# Patient Record
Sex: Male | Born: 1935 | Race: White | Hispanic: No | Smoking: Former smoker
Health system: Southern US, Community
[De-identification: ages and names within clinical notes are randomized; demographics above are authoritative.]

## PROBLEM LIST (undated history)

## (undated) DIAGNOSIS — K219 Gastro-esophageal reflux disease without esophagitis: Secondary | ICD-10-CM

## (undated) DIAGNOSIS — E785 Hyperlipidemia, unspecified: Secondary | ICD-10-CM

## (undated) DIAGNOSIS — I10 Essential (primary) hypertension: Secondary | ICD-10-CM

## (undated) DIAGNOSIS — Z87442 Personal history of urinary calculi: Secondary | ICD-10-CM

## (undated) DIAGNOSIS — I2510 Atherosclerotic heart disease of native coronary artery without angina pectoris: Secondary | ICD-10-CM

## (undated) DIAGNOSIS — I252 Old myocardial infarction: Secondary | ICD-10-CM

## (undated) DIAGNOSIS — E875 Hyperkalemia: Secondary | ICD-10-CM

## (undated) DIAGNOSIS — R931 Abnormal findings on diagnostic imaging of heart and coronary circulation: Secondary | ICD-10-CM

## (undated) DIAGNOSIS — I219 Acute myocardial infarction, unspecified: Secondary | ICD-10-CM

## (undated) DIAGNOSIS — C61 Malignant neoplasm of prostate: Secondary | ICD-10-CM

## (undated) DIAGNOSIS — Z8551 Personal history of malignant neoplasm of bladder: Secondary | ICD-10-CM

## (undated) HISTORY — DX: Acute myocardial infarction, unspecified: I21.9

## (undated) HISTORY — DX: Hyperkalemia: E87.5

## (undated) HISTORY — PX: APPENDECTOMY: SHX54

## (undated) HISTORY — PX: CORONARY ARTERY BYPASS GRAFT: SHX141

## (undated) HISTORY — DX: Essential (primary) hypertension: I10

## (undated) HISTORY — DX: Hyperlipidemia, unspecified: E78.5

## (undated) HISTORY — DX: Personal history of malignant neoplasm of bladder: Z85.51

## (undated) HISTORY — DX: Malignant neoplasm of prostate: C61

## (undated) HISTORY — DX: Personal history of urinary calculi: Z87.442

## (undated) HISTORY — PX: LITHOTRIPSY: SUR834

## (undated) HISTORY — DX: Atherosclerotic heart disease of native coronary artery without angina pectoris: I25.10

## (undated) HISTORY — DX: Abnormal findings on diagnostic imaging of heart and coronary circulation: R93.1

## (undated) HISTORY — DX: Old myocardial infarction: I25.2

---

## 1997-08-20 ENCOUNTER — Ambulatory Visit (HOSPITAL_COMMUNITY): Admission: RE | Admit: 1997-08-20 | Discharge: 1997-08-20 | Payer: Self-pay | Admitting: *Deleted

## 1997-09-11 ENCOUNTER — Ambulatory Visit (HOSPITAL_COMMUNITY): Admission: RE | Admit: 1997-09-11 | Discharge: 1997-09-11 | Payer: Self-pay | Admitting: *Deleted

## 1998-01-08 ENCOUNTER — Observation Stay (HOSPITAL_COMMUNITY): Admission: RE | Admit: 1998-01-08 | Discharge: 1998-01-09 | Payer: Self-pay | Admitting: *Deleted

## 1998-02-05 ENCOUNTER — Encounter: Payer: Self-pay | Admitting: *Deleted

## 1998-02-05 ENCOUNTER — Ambulatory Visit (HOSPITAL_COMMUNITY): Admission: RE | Admit: 1998-02-05 | Discharge: 1998-02-05 | Payer: Self-pay | Admitting: *Deleted

## 1998-09-20 ENCOUNTER — Other Ambulatory Visit: Admission: RE | Admit: 1998-09-20 | Discharge: 1998-09-20 | Payer: Self-pay | Admitting: *Deleted

## 1998-09-30 ENCOUNTER — Encounter: Admission: RE | Admit: 1998-09-30 | Discharge: 1998-12-13 | Payer: Self-pay | Admitting: Radiation Oncology

## 1998-10-12 ENCOUNTER — Encounter: Payer: Self-pay | Admitting: Radiation Oncology

## 1998-10-12 ENCOUNTER — Ambulatory Visit (HOSPITAL_COMMUNITY): Admission: RE | Admit: 1998-10-12 | Discharge: 1998-10-12 | Payer: Self-pay | Admitting: Radiation Oncology

## 1998-12-14 ENCOUNTER — Encounter: Admission: RE | Admit: 1998-12-14 | Discharge: 1999-03-14 | Payer: Self-pay | Admitting: Radiation Oncology

## 1999-06-24 ENCOUNTER — Encounter: Admission: RE | Admit: 1999-06-24 | Discharge: 1999-09-22 | Payer: Self-pay | Admitting: Radiation Oncology

## 1999-10-13 ENCOUNTER — Emergency Department (HOSPITAL_COMMUNITY): Admission: EM | Admit: 1999-10-13 | Discharge: 1999-10-13 | Payer: Self-pay | Admitting: Emergency Medicine

## 1999-10-13 ENCOUNTER — Encounter: Payer: Self-pay | Admitting: Emergency Medicine

## 2000-06-18 ENCOUNTER — Encounter: Admission: RE | Admit: 2000-06-18 | Discharge: 2000-06-18 | Payer: Self-pay | Admitting: *Deleted

## 2000-06-18 ENCOUNTER — Encounter: Payer: Self-pay | Admitting: *Deleted

## 2003-01-20 ENCOUNTER — Ambulatory Visit (HOSPITAL_COMMUNITY): Admission: RE | Admit: 2003-01-20 | Discharge: 2003-01-21 | Payer: Self-pay | Admitting: Cardiology

## 2003-01-20 HISTORY — PX: CORONARY ANGIOPLASTY: SHX604

## 2005-03-06 ENCOUNTER — Inpatient Hospital Stay (HOSPITAL_COMMUNITY): Admission: EM | Admit: 2005-03-06 | Discharge: 2005-03-08 | Payer: Self-pay | Admitting: Emergency Medicine

## 2005-03-07 HISTORY — PX: CARDIAC CATHETERIZATION: SHX172

## 2005-09-01 ENCOUNTER — Encounter: Admission: RE | Admit: 2005-09-01 | Discharge: 2005-09-01 | Payer: Self-pay | Admitting: Cardiology

## 2008-03-10 ENCOUNTER — Inpatient Hospital Stay (HOSPITAL_COMMUNITY): Admission: EM | Admit: 2008-03-10 | Discharge: 2008-03-13 | Payer: Self-pay | Admitting: Emergency Medicine

## 2008-03-11 HISTORY — PX: CYSTOSCOPY W/ URETERAL STENT PLACEMENT: SHX1429

## 2008-04-01 ENCOUNTER — Ambulatory Visit (HOSPITAL_BASED_OUTPATIENT_CLINIC_OR_DEPARTMENT_OTHER): Admission: RE | Admit: 2008-04-01 | Discharge: 2008-04-01 | Payer: Self-pay | Admitting: Urology

## 2008-04-15 ENCOUNTER — Ambulatory Visit (HOSPITAL_COMMUNITY): Admission: RE | Admit: 2008-04-15 | Discharge: 2008-04-16 | Payer: Self-pay | Admitting: Urology

## 2008-05-15 DIAGNOSIS — R931 Abnormal findings on diagnostic imaging of heart and coronary circulation: Secondary | ICD-10-CM

## 2008-05-15 HISTORY — DX: Abnormal findings on diagnostic imaging of heart and coronary circulation: R93.1

## 2009-02-12 IMAGING — CR DG ABDOMEN 1V
1 series · 1 of 1 positions shown · non-contrast
Comparison: 04/01/2008

CLINICAL DATA: Left renal calculus, preop

ABDOMEN - 1 VIEW

[t abdomen supine]
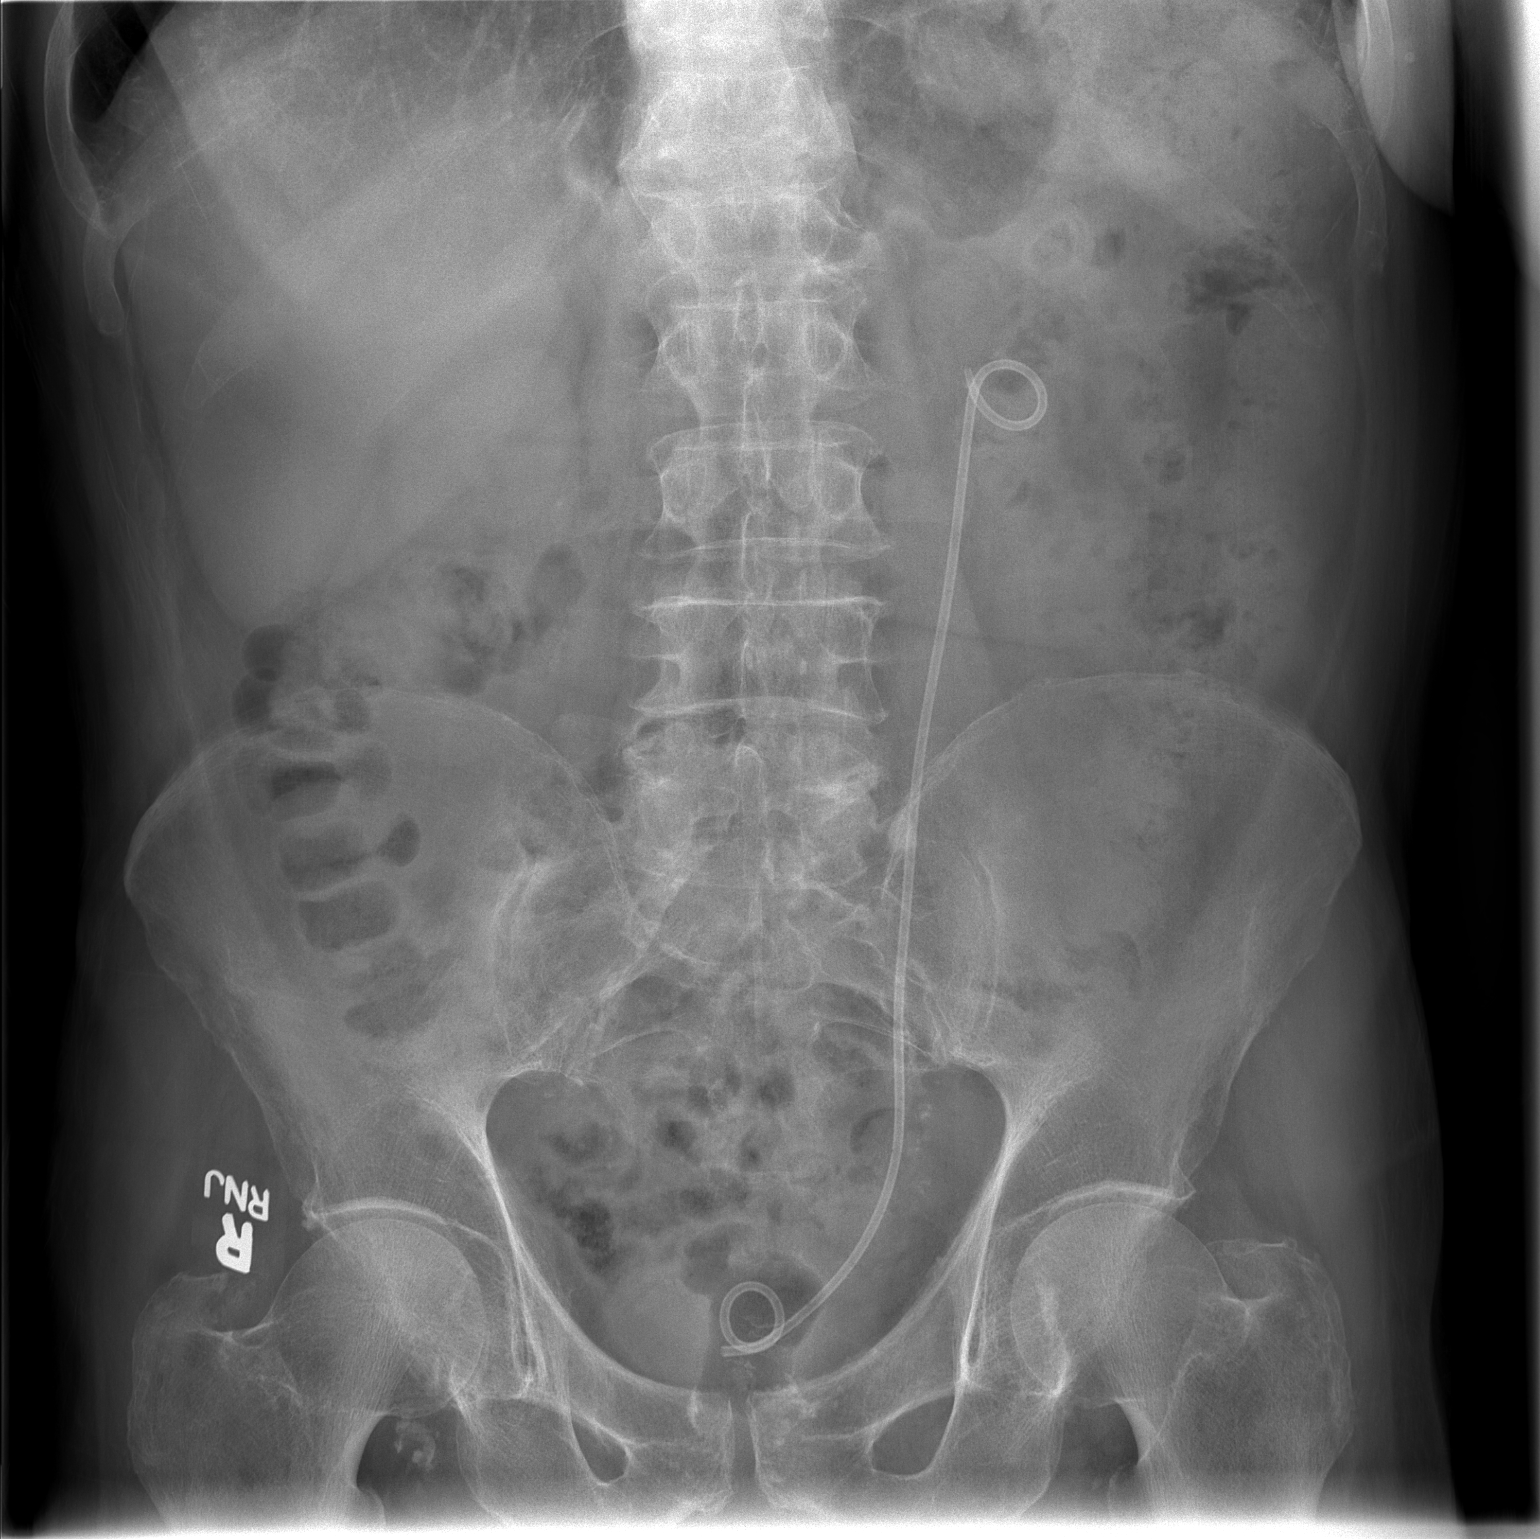

[1 of 1 positions shown; findings below may reference images not displayed]

FINDINGS: Left double-J ureteral stent projects in expected
location.  12 mm calculus projects in the mid left ureter at the
level of the upper margin of the sacroiliac joint, without
significant progression since the previous film.  Normal bowel gas
pattern.  Pelvic vascular calcifications.
IMPRESSION: 1.  Stable position of 12 mm mid-left ureteral calculus with double-
J stent in place.
2.  Normal bowel gas pattern

## 2010-02-01 ENCOUNTER — Ambulatory Visit: Payer: Self-pay | Admitting: Cardiology

## 2010-06-08 ENCOUNTER — Encounter: Payer: Self-pay | Admitting: Cardiology

## 2010-08-02 ENCOUNTER — Other Ambulatory Visit: Payer: Self-pay | Admitting: Cardiology

## 2010-08-02 ENCOUNTER — Ambulatory Visit (INDEPENDENT_AMBULATORY_CARE_PROVIDER_SITE_OTHER): Payer: Medicare Other | Admitting: Cardiology

## 2010-08-02 ENCOUNTER — Encounter: Payer: Self-pay | Admitting: Cardiology

## 2010-08-02 DIAGNOSIS — Z79899 Other long term (current) drug therapy: Secondary | ICD-10-CM

## 2010-08-02 DIAGNOSIS — E7800 Pure hypercholesterolemia, unspecified: Secondary | ICD-10-CM

## 2010-08-02 DIAGNOSIS — E785 Hyperlipidemia, unspecified: Secondary | ICD-10-CM | POA: Insufficient documentation

## 2010-08-02 DIAGNOSIS — I10 Essential (primary) hypertension: Secondary | ICD-10-CM

## 2010-08-02 DIAGNOSIS — I2510 Atherosclerotic heart disease of native coronary artery without angina pectoris: Secondary | ICD-10-CM | POA: Insufficient documentation

## 2010-08-02 LAB — COMPREHENSIVE METABOLIC PANEL
ALT: <8 U/L (ref 0–53)
AST: 17 U/L (ref 0–37)
Albumin: 4.3 g/dL (ref 3.5–5.2)
Alkaline Phosphatase: 90 U/L (ref 39–117)
BUN: 17 mg/dL (ref 6–23)
CO2: 22 mEq/L (ref 19–32)
Calcium: 9.9 mg/dL (ref 8.4–10.5)
Chloride: 99 mEq/L (ref 96–112)
Creat: 1.15 mg/dL (ref 0.40–1.50)
Glucose, Bld: 93 mg/dL (ref 70–99)
Potassium: 5 mEq/L (ref 3.5–5.3)
Sodium: 137 mEq/L (ref 135–145)
Total Bilirubin: 0.9 mg/dL (ref 0.3–1.2)
Total Protein: 7.3 g/dL (ref 6.0–8.3)

## 2010-08-02 LAB — LIPID PANEL
Cholesterol: 217 mg/dL — ABNORMAL HIGH (ref 0–200)
HDL: 40 mg/dL (ref >39)
LDL Cholesterol: 134 mg/dL — ABNORMAL HIGH (ref 0–99)
Total CHOL/HDL Ratio: 5.4 Ratio
Triglycerides: 217 mg/dL — ABNORMAL HIGH (ref <150)
VLDL: 43 mg/dL — ABNORMAL HIGH (ref 0–40)

## 2010-08-02 NOTE — Assessment & Plan Note (Signed)
Blood pressure is controlled, we'll continue current medicines.

## 2010-08-02 NOTE — Progress Notes (Signed)
Gout a few months ago in right heel.

## 2010-08-02 NOTE — Assessment & Plan Note (Signed)
Labs will be checked today. We'll continue current medicines

## 2010-08-02 NOTE — Assessment & Plan Note (Signed)
He is up-to-date on his stress followup. He's not having chest pain.

## 2010-08-02 NOTE — Progress Notes (Signed)
Mr. Sproule is here today for followup visit. Overall, he continues to do well. He's reasonably active without symptoms. He's not had any chest pain, lightheadedness, or dizziness. We discussed are pending retirement. Overall, he would like to come back and see Lawson Fiscal on a regular basis. He did have an episode of gout but has recovered from that and in general is doing well.  The review of systems is as above and all other are negative.  No Known Allergies  Current Outpatient Prescriptions on File Prior to Visit  Medication Sig Dispense Refill  . amLODipine (NORVASC) 2.5 MG tablet Take 2.5 mg by mouth daily.        Marland Kitchen aspirin 81 MG EC tablet Take 81 mg by mouth as needed.        Marland Kitchen atenolol (TENORMIN) 50 MG tablet Take 50 mg by mouth daily.        Marland Kitchen lovastatin (MEVACOR) 40 MG tablet Take 40 mg by mouth at bedtime.        . vitamin B-12 (CYANOCOBALAMIN) 1000 MCG tablet Take 500 mcg by mouth daily.       . ergocalciferol (VITAMIN D2) 50000 UNIT capsule Take 50,000 Units by mouth as needed. Every 3 months         Past Medical History  Diagnosis Date  . Coronary artery disease     Remote CABG in 1988 with last cath in 2006, last stress test in 2010  . Hyperkalemia     with ACE inhibitor  . Hypertension     controlled with Amlodipine  . Hyperlipidemia   . History of nephrolithiasis   . Myocardial infarction     July 1988/ Anterior  . Prostate cancer     treated with radiation in 2000  . History of bladder cancer   . Kidney stones     Past Surgical History  Procedure Date  . Cystoscopy w/ ureteral stent placement 03/11/08  . Appendectomy   . Lithotripsy     right sided  . Coronary artery bypass graft     01-05-88  . Cardiac catheterization 03/07/05  . Coronary angioplasty 01/20/03    left main stent     Family History  Problem Relation Age of Onset  . Heart attack Mother   . Heart disease Father   . Coronary artery disease Sister     History   Social History  . Marital  Status: Single    Spouse Name: N/A    Number of Children: N/A  . Years of Education: N/A   Occupational History  . Not on file.   Social History Main Topics  . Smoking status: Former Smoker    Quit date: 05/15/1985  . Smokeless tobacco: Never Used  . Alcohol Use: No  . Drug Use: Not on file  . Sexually Active: Not on file   Other Topics Concern  . Not on file   Social History Narrative  . No narrative on file    BP 130/70  Pulse 76  Wt 147 lb 12.8 oz (67.042 kg)  He's well-developed well-nourished. He's alert. Skin is warm and dry. HEENT is unremarkable. Pupils equal round reactive light. Neck is supple without bruits. Lungs are clear. Heart shows regular rate without murmur. Abdomen soft nontender. Extremities are without edema. Neurologically he is intact.

## 2010-09-27 NOTE — H&P (Signed)
[name], [name]                ACCOUNT NO.:  [id number]   MEDICAL RECORD NO.:  [id number]          PATIENT TYPE:  EMS   LOCATION:  ED                           FACILITY:  [facility]   PHYSICIAN:  Bertram Millard. Dahlstedt, M.D.DATE OF BIRTH:  1923-10-08   DATE OF ADMISSION:  03/10/2008  DATE OF DISCHARGE:                              HISTORY & PHYSICAL   REASON FOR ADMISSION:  Left kidney stone.   BRIEF HISTORY:  A 77 year old male seen for quite a few years by Dr.  Vernie Ammons.  The patient has a history of transitional cell carcinoma of  bladder, and has had TURBT.  Additionally, he has prostate cancer.  He  has undergone external beam radiotherapy post Lupron therapy.  His PSA  has remained undetectable from its high level of 39 at the time of  diagnosis.   The patient does have a history of urolithiasis.  He had lithotripsy  back in 1999.  The patient was recently seen by Dr. Vernie Ammons in the  office, and had a normal cystoscopy.  Over the past few days, he has  developed left flank pain.  This has become intolerable, is crampy in  nature and associated with nausea and vomiting.  He has had a low-grade  fever at home.  He presented to the emergency room at [facility].  He  was diagnosed with a urinary tract infection and a CT scan with p.o.  contrast was performed.  This revealed an obstructing bunch of stones in  the left mid ureter with significant hydronephrosis.  Urologic  consultation is requested.   PAST MEDICAL HISTORY:  Significant for coronary artery disease.  He is  status post a bypass graft in 1989.  He is followed by Dr. Delfin Edis.  He has hyperlipidemia.  He has a history of tobacco abuse, having quit  many years ago.  He has had an appendectomy, a prostate biopsy, a lymph  node biopsy for his prostate cancer and the previously-mentioned right-  sided lithotripsy.   MEDICATIONS:  Include aspirin, Tenormin and Vytorin.   There are no known drug allergies.   The  patient is widowed.  He comes to the emergency room with a close  friend/neighbor.  There is no history of alcohol use, and a remote  history of cigarette use.   Exam revealed a pleasant elderly male.  He was having some rigors and  some discomfort.  He was alert and oriented.  Blood pressure 115/61, pulse 112, respiratory rate 16, temperature 99.2.  The neck was supple.  No thyromegaly or adenopathy.  CHEST:  Clear.  HEART:  Normal rate and rhythm.  ABDOMEN:  Flat, soft, nondistended with minimal left CVA and upper  quadrant tenderness.  No rebound, no guarding.  GENITALIA:  Exam deferred.   LABORATORY:  BMET was normal except for a creatinine of 1.6.  CBC:  White count 25,000, hematocrit 39%, low platelet count of 149,000.  Urinalysis revealed an infection.   Review of the patient's CT revealed significant left hydronephrosis down  to the mid ureter, where a large stone was present.  There were  calcifications within the prostate.   IMPRESSION:  1. Left obstructing ureteral stones with urinary tract infection.      This is an urgent matter at the present time.  2. History of transitional cell carcinoma of the bladder, no evidence      of recurrence.  3. Adenocarcinoma of the prostate, nodal positive, status post      external beam radiotherapy and hormone deprivation, with excellent      response   PLAN:  1. The patient has already been given antibiotics intravenously, and      will be stented tonight.  2. I have discussed the urgent nature of the patient's situation - in      an 77 year old who is somewhat frail with an infection and a stone,      I think urgent care is necessary.  He will most likely be admitted      for a few days to cool down with IV antibiotics.      Bertram Millard. Dahlstedt, M.D.  Electronically Signed     SMD/MEDQ  D:  03/10/2008  T:  03/11/2008  Job:  [id number]   cc:   Veverly Fells. Vernie Ammons, M.D.  Fax: [phone]   Colleen Can. Deborah Chalk, M.D.  Fax:  [phone]

## 2010-09-27 NOTE — Op Note (Signed)
[name], [name]                ACCOUNT NO.:  [id number]   MEDICAL RECORD NO.:  [id number]          PATIENT TYPE:  OIB   LOCATION:  1444                         FACILITY:  [facility]   PHYSICIAN:  Mark C. Vernie Ammons, M.D.  DATE OF BIRTH:  [birth year]   DATE OF PROCEDURE:  04/15/2008  DATE OF DISCHARGE:                               OPERATIVE REPORT   PREOPERATIVE DIAGNOSIS:  Left ureteral calculi.   POSTOPERATIVE DIAGNOSIS:  Left ureteral calculi.   PROCEDURES:  1. Cystoscopy.  2. Left ureteral catheterization.  3. Removal of double-J stent.  4. Left ureteroscopy.  5. In situ laser lithotripsies.  6. Ureteroscopic stone extraction.  7. Double-J stent placement.   SURGEON:  Dr. Vernie Ammons.   ANESTHESIA:  General.   SPECIMENS:  Stone given to the patient.   BLOOD LOSS:  Minimal.   DRAINS:  An 8-French, 24 cm Polaris stent in the left ureter (no  string).   COMPLICATIONS:  None.   INDICATIONS:  The patient is an 77 year old white male who was  originally admitted to the hospital with severe left flank pain, as well  as severe left hydronephrosis due to what appeared to be several stones  lodged in the mid ureter.  A double-J stent was placed to decompress the  system.  After adequate antibiotic therapy, he was brought to the  operating room where I performed ureteroscopy and laser lithotripsy of a  large impacted stone in the mid ureter.  In performing this, the  markedly dilated proximal ureter allowed the other stones that were  present in the ureter to migrate back up into the kidney and at that  time I elected to terminate the procedure due to the length of the case  and the patient's advanced age.  I discussed going back in and the  removing the remaining stones.  The patient understands and has elected  to proceed.  A preoperative KUB revealed the stones that had migrated up  into the kidney and fallen back into the ureter and were now located in  the mid ureteral  region.   DESCRIPTION OF OPERATION:  After informed consent, the patient brought  to the major OR, placed on the table, administered general anesthesia  and then moved to the dorsal lithotomy position.  His genitalia were  sterilely prepped and draped and an official timeout was then performed.   A 22-French cystoscope with 12 degrees lens was introduced per urethra  under direct visualization with no abnormal abnormalities noted.  The  bladder was entered and the stent was identified and grasped with  alligator forceps and withdrawn to the urethral meatus.   Through the double-J stent, I passed a 0.038 inches floppy tip guidewire  through the stent and then up the left ureter under direct fluoroscopic  control into the area of the renal pelvis.  This was secured and  maintained as a safety guidewire.   I next used a 6-French rigid ureteroscope and passed this under direct  visualization into a left ureter and easily up the ureter to about the  mid ureter where I  noted relative narrowing/stricture, but it allowed  the scope to easy pass through this just.  Beyond this there appeared to  be a stone, so I injected full strength contrast to perform a retrograde  pyelogram.   Retrograde pyelogram was performed by injecting full strength contrast  through the ureteroscope and I noted no extravasation, but there was  some mild dilatation of the proximal ureter.  A filling defect in the  mid ureter consistent with a stone was identified.  There appeared to be  some cystitis cystica/inflammation of the upper ureter as well.  There  was mild blunting of the calyces consistent with longstanding  hydronephrosis.  No intrarenal or caliceal filling defects were  identified.   Using a 360 micron holmium laser fiber, I then fragmented the stone into  multiple smaller bits.  After fragmenting this completely, I then passed  a nitinol basket and engaged the stone fragments and extracted these   individually without difficulty.  In order to be sure I had gotten all  of the stones, I withdrew the rigid ureteroscope under direct  visualization and I could not identify any further stones in the ureter,  but I wanted to pass the flexible scope just to be sure I had a full  visualization of the ureter.   A 0.038 inch floppy tip guidewire was passed up the ureter into the  renal pelvis under fluoroscopic control and then I passed the 6-French  flexible ureteroscope over the guidewire into the area of the renal  pelvis and removed the guidewire.  The renal pelvis and caliceal systems  were inspected and no stones could be identified, although there was  some blood and clot seen in the pelvis that obscured the visualization  to some degree.  I then withdrew the flexible ureteroscope down the  ureter and noted no stones in the upper ureter.  There was a single  stone seen in the mid ureter with mild flexion of the scope, but could  not be seen with the rigid scope, so I was able to grasp this with the  nitinol basket and extract it.  I then reinserted the flexible  ureteroscope and passed this up the ureter again making sure the ureter  was completely free of stones and finding no stones present within the  ureter.  I therefore removed the ureteroscope.   I back loaded the cystoscope over the working guidewire and passed the  stent over the guidewire into the area of the renal pelvis.  A good curl  was noted as I removed the guidewire.  I then inspected the bladder and  saw some stones on the floor of the bladder.  I therefore used the  Toomey syringe to irrigate out all of the stone fragments and small  clots in the bladder and re-inspection revealed no further stones or  clots present in the bladder.  I therefore drained the bladder,  instilled 2% lidocaine jelly in the urethra and applied a penile clamp.  The patient was awakened and taken to the recovery room in stable  satisfactory  condition.  He tolerated the procedure well.  There were no  intraoperative complications.   He will be observed overnight in the hospital due to his advanced age  and the fact that he has no one and at home to care for him.  He will be  maintained on antibiotics and discharged in the morning.      Mark C. Vernie Ammons, M.D.  Electronically  Signed     MCO/MEDQ  D:  04/15/2008  T:  04/15/2008  Job:  [id number]

## 2010-09-27 NOTE — Discharge Summary (Signed)
[name], VINZANT                ACCOUNT NO.:  [id number]   MEDICAL RECORD NO.:  [id number]          PATIENT TYPE:  INP   LOCATION:  1433                         FACILITY:  [facility]   PHYSICIAN:  Mark C. Vernie Ammons, M.D.  DATE OF BIRTH:  [birth year]   DATE OF ADMISSION:  03/10/2008  DATE OF DISCHARGE:  03/13/2008                               DISCHARGE SUMMARY   PRINCIPAL DIAGNOSES:  1. Obstructing left ureteral calculus.  2. Left pyelonephritis.  3. Asymptomatic thrombocytopenia.   MAJOR OPERATION:  Cystoscopy with left double-J stent placement.   DISPOSITION:  The patient is discharged home in stable satisfactory and  improved condition.  He is afebrile with a normal white count and no  complaints of pain.  He has an indwelling left ureteral stent.   DISCHARGE FOLLOWUP:  His followup will be in my office in one week.   DISCHARGE MEDICATIONS:  Unchanged from admission with the addition of  Cipro XR 1 g daily for 14 days.   DIET:  Unrestricted.   ACTIVITY:  Unrestricted.   BRIEF HISTORY:  The patient is an 77 year old white male who has a prior  history of bladder cancer and prostate cancer.  He was just in the  office last week and had no complaints with normal cystoscopy at that  time.  He then developed left flank pain that was crampy in nature and  associated nausea and vomiting.  He went to the emergency room where he  was found to have an obstructing left ureteral stone with marked  hydronephrosis.  He was admitted to the hospital after having an  emergent double-J stent placement.  His complete history and physical  was previously dictated and will not be repeated here.   HOSPITAL COURSE:  Upon admission the patient was found to have what  appeared to be infected urine.  His creatinine was slightly elevated to  1.58 and his white count was elevated to 24.8.   He underwent a left double-J stent placement by Dr. Retta Diones without  difficulty and was then observed in the  hospital on intravenous Cipro.  His white count returned to normal by his third postoperative at which  time it was 10.8.  His temperature curve was consistent with  pyelonephritis with spiking temperature at night that continued to fall  each day.  He complained of no discomfort from the stent throughout his  hospitalization.  He did have a Foley catheter indwelling and his urine  remained clear.   His urine culture came back positive for E.coli that was pansensitive  except to ampicillin to which it was resistant.  His blood culture was  found to be negative, ruling out urosepsis.   Throughout his hospitalization his platelet count was noted to be low,  however, remained asymptomatic with stable hemoglobin and this will be  followed up as an outpatient.   At this time he is afebrile without complaint and is felt ready for  discharge.  His followup will be as noted above.      Mark C. Vernie Ammons, M.D.  Electronically Signed  MCO/MEDQ  D:  03/13/2008  T:  03/13/2008  Job:  [id number]

## 2010-09-27 NOTE — Op Note (Signed)
[name], [name]                ACCOUNT NO.:  [id number]   MEDICAL RECORD NO.:  [id number]          PATIENT TYPE:  INP   LOCATION:  0098                         FACILITY:  [facility]   PHYSICIAN:  Bertram Millard. Dahlstedt, M.D.DATE OF BIRTH:  [birth year]   DATE OF PROCEDURE:  03/11/2008  DATE OF DISCHARGE:                               OPERATIVE REPORT   PREOPERATIVE DIAGNOSIS:  Left obstructing ureteral stone with  pyelonephritis.   POSTOPERATIVE DIAGNOSIS:  Left obstructing ureteral stone with  pyelonephritis.   PRINCIPAL PROCEDURE:  Cystoscopy, left double-J stent placement (24 cm x  7-French contour).   SURGEON:  Bertram Millard. Dahlstedt, M.D.   ANESTHESIA:  General endotracheal.   COMPLICATIONS:  None.   BRIEF HISTORY:  An 77 year old male with a prior urologic history of  transitional cell carcinoma of the bladder, adenocarcinoma of the  prostate (status post radiotherapy and androgen deprivation) and a  history of urolithiasis, having had a right-sided lithotripsy  approximately 10 years ago.  He was recently seen in the office for  routine surveillance of his prostate cancer and bladder cancer.  He was  found to be without evidence of disease.  He developed left flank pain  shortly thereafter, presented to the emergency room tonight with shakes,  chills, left flank pain, nausea, vomiting and fever.  Evaluation  included a CT scan with p.o. contrast.  This revealed an obstructing  left mid ureteral stone, quite large, with hydronephrosis and a UTI.  He  was febrile and tachycardia.  Upon evaluation the patient, I recommended  to the patient that he undergo emergent decompression of his left renal  unit.  Cystoscopy and double-J stent were recommended.  Risks and  complications of the procedure were discussed with the patient and his  friend.  They understand these and desire to proceed.   DESCRIPTION OF PROCEDURE:  The patient was identified in the holding  area in the surgical  side marked.  He had already received 400 mg of  Cipro I.V.  He was taken to the operating room where general anesthetic  was administered with the endotracheal apparatus.  He was placed in the  dorsal lithotomy position.  Genitalia and perineum were prepped and  draped.   Time-out was then called.   A 22-French panendoscope was passed under direct vision through his  urethra.  The prostatic urethra was not obstructed.  The bladder was  entered and inspected circumferentially.  It was mildly hyperemic in  spots.  The left ureteral orifice was identified and a Sensor tip  guidewire advanced under direct vision through this area, up into the  left renal pelvis, past the obstructing left mid ureteral stones.  Over  top of this, a 24 cm x 7-French Contour double-J stent was placed.  Passing the stone was somewhat difficult, but adequate positioning was  seen.  Once the stent was  adequately positioned, the guidewire was removed.  Good proximal and  distal coils were seen.  The bladder was drained.  A 16-French Foley  catheter was placed and hooked to dependent drainage.  The patient  tolerated procedure well.  He was awakened and taken to PACU in stable  condition.      Bertram Millard. Dahlstedt, M.D.  Electronically Signed     SMD/MEDQ  D:  03/11/2008  T:  03/11/2008  Job:  [id number]   cc:   Veverly Fells. Vernie Ammons, M.D.  Fax: [phone]   Colleen Can. Deborah Chalk, M.D.  Fax: [phone]

## 2010-09-27 NOTE — Op Note (Signed)
[name], EVILSIZER                ACCOUNT NO.:  [id number]   MEDICAL RECORD NO.:  [id number]          PATIENT TYPE:  AMB   LOCATION:  [facility]                         FACILITY:  [facility]   PHYSICIAN:  Mark C. Vernie Ammons, M.D.  DATE OF BIRTH:  Nov 08, 1923   DATE OF PROCEDURE:  04/01/2008  DATE OF DISCHARGE:                               OPERATIVE REPORT   PREOPERATIVE DIAGNOSIS:  Left ureteral stone.   POSTOPERATIVE DIAGNOSIS:  Left ureteral stone.   PROCEDURE:  1. Diagnostic cystoscopy.  2. Removal of left ureteral stent.  3. Left ureteroscopy.  4. Laser in situ lithotripsy of left ureteral stone.  5. Ureteroscopic extraction of stone.  6. Retrograde pyelogram with imbrication.  7. Double-J stent placement.   SURGEON:  Dr. Vernie Ammons.   ANESTHESIA:  General.   BLOOD LOSS:  Minimal.   DRAINS:  7-French, 24-cm double-J stent in the left ureter (no string).   SPECIMENS:  Stone sent for compositional analysis.   COMPLICATIONS:  None.   INDICATIONS:  The patient is an 77 year old male who has a history of  transitional cell carcinoma of the bladder as well as prostate cancer.  He was found recently to have a very large left ureteral calculus that  was seen at the time of admission for severe left flank pain, and at  that time, a CT scan revealed marked hydronephrosis with a very large  stone in the left ureter that appeared to possibly be more than one  stone.  I discussed the procedure as well as its risks and complications  with the patient, and he understands and has elected to proceed.   DESCRIPTION OF OPERATION:  After informed consent, the patient brought  to major OR, placed on the table, administered general anesthesia, then  moved to the dorsal lithotomy position.  Genitalia was sterilely prepped  and draped, and an official time out was then performed.  Initially, the  21-French cystoscope was passed per urethra under direct visualization,  and urethra was noted be normal,  and the prostatic urethra revealed no  lesions but was somewhat fixed due to prior radiation therapy.  Upon  entering the bladder, the bladder was noted to be free of any tumors or  stones.  I did a full inspection of the bladder and noted some mild  inflammation in the area of the left ureteral orifice where the stent  was exiting but no worrisome lesions were identified within the bladder.   The alligator graspers were then used to grasp the tip of the stent, and  this was withdrawn to the urethral meatus ,and a 0.038-inch floppy tip  guidewire was then passed through the stent and up the left ureter under  direct fluoroscopic control.  I then removed the stent and left the  guidewire in place as a safety guidewire.  Next, a 6-French rigid  ureteroscope was then used to perform ureteroscopy.   Ureteroscope was passed under direct visualization into the bladder and  into the left ureteral orifice and up the left ureter.  I found it  difficult to advance the scope, and  I could not visualize the stone  easily.  I therefore passed a second guidewire under fluoroscopic  control into the area of the renal pelvis and removed the ureteroscope  and dilated the ureter at the mid ureteral level with ureteral dilating  balloon to 40 atmospheres for several minutes, then deflated this and  tried to pass the scope again but was unsuccessful.   With a second working guidewire in place as well as a safety guidewire,  I passed the 6-French flexible ureteroscope over the guidewire and was  able to then remove the guidewire and visualize the stone.  I used the  360 micron holmium laser fiber then to fragment a very large stone, and  as I fragmented the stone, I then removed portions of the stone to allow  visualization of the remaining stone more proximal using a nitinol  basket.  Because I realized there was going to be multiple passes  required, I reinserted the second guidewire and passed the  ureteral  access sheath over the guidewire and then passed the flexible  ureteroscope through the access sheath.  I then alternated with laser  lithotripsy and nitinol stone basketing until I was able to remove all  of the stone fragments located in the mid ureter.  Prior to performing  any fragmenting of the stone, I did perform a retrograde pyelogram.   The retrograde pyelogram was performed by passing the ureteroscope up  the ureter and then injecting full strength contrast through the  ureteroscope under direct fluoroscopic control.  In doing that, I noted  a filling defect at the mid ureter where the most distal stone was  located.  I then noted what appeared to be a filling defect more  proximal to that as well.   Once the stone that was essentially impacted and very large located at  the mid ureter was completely fragmented and removed, I then passed the  flexible ureteroscope on into the area of the renal pelvis, although I  could not visualize the other stone at that time.  It had been 2 hours  into the procedure, and I therefore elected to remove the ureteroscope  and over the safety guidewire.  I back loaded the cystoscope and then  passed the 7-French double-J stent over the guidewire into the area of  the renal pelvis, and as I removed the guidewire, good curl was noted in  the renal pelvis and in the bladder.  The bladder had multiple stone  fragments, and I therefore used a Toomey syringe to remove these  completely and reinspected the bladder and noted no further stone  fragments present.  I therefore removed the cystoscope, and the patient  was awakened and taken to recovery in stable, satisfactory condition.  He tolerated the procedure well.  There were no intraoperative  complications.  He will be given a prescription for 28 Vicodin and 36  Pyridium.  He will follow-up with me in 1 week and will likely need a CT  scan to reassess remaining stone burden.      Mark  C. Vernie Ammons, M.D.  Electronically Signed     MCO/MEDQ  D:  04/01/2008  T:  04/02/2008  Job:  [id number]

## 2010-09-30 NOTE — H&P (Signed)
NAME:  [name], [name] NO.:  [id number]   MEDICAL RECORD NO.:  [id number]                   PATIENT TYPE:  OIB   LOCATION:                                       FACILITY:  [facility]   PHYSICIAN:  Colleen Can. Deborah Chalk, M.D.            DATE OF BIRTH:  [birth year]   DATE OF ADMISSION:  01/20/2003  DATE OF DISCHARGE:                                HISTORY & PHYSICAL   CHIEF COMPLAINT:  None.   HISTORY OF PRESENT ILLNESS:  The patient is a very pleasant 77 year old male  who has known coronary artery disease. He has previous anterior MI which  dates back to 1988 and at that time was treated with acute angioplasty. He  subsequently was referred on for coronary artery bypass grafting in 1989. He  presented for his regular followup appointment in the mid part of July and  was referred for a stress Cardiolite study. Clinically, he had done well  without complaints of chest pain or shortness of breath.   Stress Cardiolite study was performed on August 24 which demonstrated fair  exercise tolerance, adequate blood pressure response; clinically, there were  no symptoms; the EKG was abnormal at rest but no changes consistent with  ischemia; there was reduced ejection fraction from 2002 with an anterior  apical perfusion defect with equivocal ischemia. In light of these findings,  it is felt that he should now undergo repeat cardiac catheterization. He  continues to remain stable clinically.   PAST MEDICAL HISTORY:  1. Anterior myocardial infarction in July of 1988 with subsequent coronary     artery bypass grafting x4 in 1989 with left internal mammary to the LAD,     vein graft to the OM2 and OM3, and vein graft to the PDA.  2. Hyperlipidemia.  3. Fatigue.  4. Situational stress.   ALLERGIES:  None.   CURRENT MEDICATIONS:  1. Zetia 10 mg a day.  2. Zocor 80 mg a day.  3. Multivitamin daily.  4. Tenormin 25 mg at bedtime.  5. Lupron injections every four  months.   FAMILY HISTORY:  Noncontributory.   SOCIAL HISTORY:  He is married. He cares for his chronically ill wife who  has significant orthopedic aliments. He has no alcohol or tobacco use.   REVIEW OF SYSTEMS:  He has had some chronic fatigue. He does not sleep at  well but really denies being depressed. He has had no energy but feels it is  due more to the heat. He has had no chest pain, no shortness of breath.   PHYSICAL EXAMINATION:  GENERAL:  He is a pleasant elderly white male in no  acute distress. Weight 168.5 pounds, blood pressure is 160/80 sitting-150/90  standing, heart rate 76 and regular, respirations 18, he is afebrile.  SKIN:  Warm and dry. Color is unremarkable.  LUNGS:  Clear.  HEART:  Shows a regular rhythm.  ABDOMEN:  Soft, positive bowel sounds, nontender.  EXTREMITIES:  Without edema.  NEUROLOGICAL:  Intact without gross focal deficits.   PERTINENT LABORATORY DATA:  Pending.   OVERALL IMPRESSION:  1. Abnormal stress Cardiolite.  2. Known coronary disease with previous anterior myocardial infarction in     1988, subsequent bypass surgery x4 in 1989.  3. Fatigue.  4. Situational stress.  5. Hypertension.  6. Hyperlipidemia.  7. History of prostate cancer, maintained on Lupron.   PLAN:  Will proceed on with elective cardiac catheterization. The procedure  has been discussed in full detail, and he is willing to proceed on Tuesday,  January 20, 2003.       Juanell Fairly C. Earl Gala, N.P.                 Colleen Can. Deborah Chalk, M.D.    LCO/MEDQ  D:  01/14/2003  T:  01/14/2003  Job:  [id number]

## 2010-09-30 NOTE — Discharge Summary (Signed)
[name], [name]                ACCOUNT NO.:  [id number]   MEDICAL RECORD NO.:  [id number]          PATIENT TYPE:  INP   LOCATION:  2901                         FACILITY:  [facility]   PHYSICIAN:  Colleen Can. Deborah Chalk, M.D.DATE OF BIRTH:  12/31/1923   DATE OF ADMISSION:  03/06/2005  DATE OF DISCHARGE:  03/08/2005                                 DISCHARGE SUMMARY   PRIMARY DISCHARGE DIAGNOSIS:  Recurrent chest pain in the setting of known  atherosclerotic cardiovascular disease with subsequent elective cardiac  catheterization showing inferior apical hypokinesis with ejection fraction  is 40%, left main coronary artery demonstrates a patent stent, the left  anterior descending artery has irregularity of 30-40% throughout but not  obstructive.  The left internal mammary to the left anterior descending  artery is known to be atretic.  The left circumflex is 100% occluded with an  ostial 50% narrowing.  The right coronary artery is 100% occluded.  Saphenous vein graft to the posterior descending is patent.  There is a 70%  retrograde narrowing before the crux.  Saphenous vein graft to the office  obtuse marginal-2 and obtuse marginal-3 in a sequential manner is patent but  with the potential for ischemia retrograde.  The patient will continue with  medical management.   SECONDARY DISCHARGE DIAGNOSES:  1.  Known atherosclerotic cardiovascular disease.  2.  He has had a previous history of an anterior myocardial infarction in      1988, treated with angioplasty to the left anterior descending artery      and subsequently undergoing coronary artery bypass grafting x4 in 1989.  3.  Hyperlipidemia.  4.  Left ventricle dysfunction.  5.  Remote tobacco abuse.  6.  History of prostate cancer.   HISTORY OF PRESENT ILLNESS:  The patient is a very pleasant 77 year old  male.  He has a known history of ischemic heart disease.  He had been seen  in the office on the prior Friday before this admission  for his routine  followup appointment.  At that time he was felt to be stable.  However, over  the course of that subsequent weekend he began to develop recurrent episodes  of chest pain that was responsive to nitroglycerin.  He took a total of four  nitroglycerin one day prior to admission.  It would occur mid sternal and  spread to both sides.  He was also nauseated and diaphoretic but not short  of breath.  He was subsequently referred to the emergency room where he was  seen and admitted for further evaluation.   Please see dictated history and physical per the patient presentation  profile.   LABORATORY DATA:  His cardiac enzymes were negative.  EKG showed a sinus  rhythm with no acute changes.  BNP was 59.  CBC was normal.  Chemistries  were normal as well.   HOSPITAL COURSE:  The patient was admitted electively.  He underwent cardiac  catheterization on March 07, 2005.  Those results are as noted above.  The  patient is felt to need to continue his medical management.  We  subsequently  proceeded on with spiral CT scanning which showed no evidence of pulmonary  embolus, however, there were scattered right pulmonary nodules with the  largest measuring 6-mm in the right upper lobe and while these may represent  non-calcified granulomas, the presence of emphysema suggest the patient  would be at high risk for a pulmonary nodule and a 6-to-45-month followup  repeat CT scan is clinically indicated.  There were low attenuation lesions  within both kidneys, the largest which was felt to represent a cyst.  There  was unilateral right sided calcified and non-calcified pleural plaques.   Following the catheterization and CT scanning, the patient was felt to be  stable for discharge with further outpatient management to occur.   DISCHARGE CONDITION:  Stable.   DISCHARGE MEDICATIONS:  1.  Vytorin 10/80 daily.  2.  Aspirin daily.  3.  Multivitamin daily.  4.  Atenolol 25 mg a day.   5.  And will be adding Protonix 40 mg a day.   He is to place an ice pack as needed to the groin.   DIET:  Heart healthy.   We will see him back in the office in one week.  He needs to call to set  that appointment up.  Certainly, sooner if problems arise.      Sharlee Blew, N.P.      Colleen Can. Deborah Chalk, M.D.  Electronically Signed    LC/MEDQ  D:  04/21/2005  T:  04/21/2005  Job:  [id number]   cc:   Colleen Can. Deborah Chalk, M.D.  Fax: [phone]

## 2010-09-30 NOTE — Cardiovascular Report (Signed)
[name], [name] NO.:  [id number]   MEDICAL RECORD NO.:  [id number]                   PATIENT TYPE:  OIB   LOCATION:  2874                                 FACILITY:  [facility]   PHYSICIAN:  Colleen Can. Deborah Chalk, M.D.            DATE OF BIRTH:  08-Jan-1924   DATE OF PROCEDURE:  01/20/2003  DATE OF DISCHARGE:                              CARDIAC CATHETERIZATION   HISTORY:  [name] had previous myocardial infarction in 1988 and  subsequently had coronary artery bypass grafting in 1989.  Cardiolite study  was performed on August 24 which showed fair exercise tolerance, but changes  consistent with anterior apical perfusion defect with equivocal ischemia.  In light of these findings, felt he should undergo cardiac catheterization.   PROCEDURE:  Left heart catheterization with selective coronary angiography,  left ventricular angiography, angiography left internal mammary artery,  angiography of saphenous vein graft x2, stent placement left main coronary  artery.   TYPE AND SITE OF ENTRY:  Percutaneous, right femoral artery.   CATHETERS:  6-French 4 curved Judkins right and left coronary catheters, 6-  French pigtail ventriculographic catheter, mammary graft catheter, 7-French  JL4 guide catheter, high-torque floppy guidewire, initially a 2.5 x 10 mm  CrossSail, subsequently a 3.0 x 10 mm cutting balloon, subsequently a 3.5 x  13 mm Cypher stent.   CONTRAST MATERIAL:  Omnipaque.   MEDICATIONS GIVEN PRIOR TO THE PROCEDURE:  Valium 10 mg p.o.   MEDICATIONS GIVEN DURING THE PROCEDURE:  Integrilin, heparin, nitroglycerin.   COMMENTS:  The patient tolerated the procedure well.   HEMODYNAMIC DATA:  1. The aortic pressure was 137/72.  2. LV was 170/9-11.  However, on pullback there was no gradient.   ANGIOGRAPHIC DATA:  1. Left main coronary artery has 85% distal narrowing.  2. Left anterior descending had diffuse atherosclerosis.  There were four   diagonal vessels.  There were at least two 50-60% narrowings in a     diffusely diseased left anterior descending.  No bidirectional flow could     be noted.  3. Left circumflex has ostial calcification and what would appear to be a     greater than 70% narrowing.  There is a 90% stenosis in the second     marginal and greater than 90-95% stenosis in a third marginal vessel.     Bidirectional flow is noted in the second marginal branch.  4. Right coronary artery is totally occluded proximally.  5. Saphenous vein graft to the second and third marginal is widely patent.     There is excellent runoff in the second obtuse marginal.  The third     obtuse marginal is diffusely diseased but there is continued patency of     the saphenous vein graft.  There is retrograde fill of the left     circumflex demonstrating a more proximal stenosis in the second obtuse  marginal.  The first obtuse marginal does fill from this vein graft.  6. Saphenous vein graft to the posterior descending is widely patent.  There     is satisfactory insertion and good distal runoff.  There is 70% stenosis     at or just before the crux.  There is satisfactory flow into the     posterolateral branch.  7. Left internal mammary graft is atretic.   LEFT VENTRICULOGRAM:  Left ventricular angiogram was performed in the RAO  position.  Overall cardiac size is normal.  There is inferior basilar  hypokinesia and inferior apical akinesia.  There is no mitral regurgitation.  Global ejection fraction is approximately 45%.  The apex is akinetic.  There  is no intracavitary filling defects noted.   ANGIOPLASTY PROCEDURE:  We elected to proceed on with an angioplasty of the  left main.  The JL4 guide and high-torque floppy guidewire provided fair  support.  We were able to manipulate catheters across the distal stenosis in  the left main but support with the JL4 guide was just barely satisfactory.  We initially tried to pass the  cutting balloon, but were unsuccessful.  We  then returned with the 2.5 x 10 mm CrossSail balloon to high atmospheres.  Subsequently, returned with the cutting balloon, inflated it to a maximum of  14 atmospheres.  We then returned with a 3.5 x 13 mm Cypher stent inflated  to a maximum of 16 atmospheres.  The final angiographic result was felt to  be excellent.  There was calcification in this lesion in the left main  coronary artery but the result was felt to be excellent with less than 10%  residual narrowing.   OVERALL IMPRESSION:  1. Severe stenosis left main coronary artery with successful stent     placement.  2. Persistent patency of the saphenous vein grafts to the obtuse marginal 2     and obtuse marginal 3 with patent saphenous vein graft to the posterior     descending vessel, but with atretic left internal mammary artery graft to     the left anterior descending.  3. Moderate left ventricular dysfunction with apical akinesia and inferior     basilar hypokinesia.                                               Colleen Can. Deborah Chalk, M.D.    SNT/MEDQ  D:  01/20/2003  T:  01/20/2003  Job:  [id number]

## 2010-09-30 NOTE — Cardiovascular Report (Signed)
NAMEJUANCARLOS, [name]                ACCOUNT NO.:  [id number]   MEDICAL RECORD NO.:  [id number]          PATIENT TYPE:  INP   LOCATION:  2901                         FACILITY:  [facility]   PHYSICIAN:  Colleen Can. Deborah Chalk, M.D.DATE OF BIRTH:  1923-06-29   DATE OF PROCEDURE:  03/07/2005  DATE OF DISCHARGE:                              CARDIAC CATHETERIZATION   PROCEDURE:  Left heart catheterization with selective coronary angiography,  left ventricular angiography, saphenous vein angiography x2.   TYPE AND SITE OF ENTRY:  Percutaneous right femoral artery with AngioSeal.   CATHETERS:  6-French 4 curved Judkins right and left coronary catheters, 6-  French pigtail ventriculographic catheter.   CONTRAST MATERIAL:  Omnipaque.   MEDICATIONS:  Given prior to procedure, Valium 10 milligrams.   MEDICATIONS:  Given during the procedure, Versed 2 milligrams IV.   COMMENT:  The patient tolerated the procedure well.   HEMODYNAMIC DATA:  Aortic pressure of 137/61, LV pressure was 158/6-11.  There was no aortic valve gradient, however, on pullback.   ANGIOGRAPHIC DATA:  Left main coronary artery had a stent that was patent.  There were irregularities but excellent flow. The left anterior descending  also had excellent flow throughout without significant obstruction. There  were diffuse irregularities of 20-30% and at most 40% narrowing scattered  throughout the left anterior descending but no focal obstructive disease.   The left internal mammary artery to the LAD was known to be atretic from  previous catheterization. We avoided the risk of trying to locate that  obstructed graft.   Left circumflex:  Left circumflex is totally occluded. There was flow into  the smaller proximal marginals but second and third marginal systems are  occluded. There was 50-60% ostial narrowing present.   Saphenous vein graft to obtuse marginal-1 and obtuse marginal-2 were widely  patent. There was retrograde fill  and it is a potential for possible  ischemia, although that vessel does have antegrade flow in that location as  well.   The right coronary artery was totally occluded proximally.   Saphenous vein graft to the posterior descending arteries was widely patent.  There was 60-70% narrowing in the right coronary artery native in a  retrograde manner before the crux of the vessel but overall appears that the  vessel had satisfactory revascularization.   OVERALL IMPRESSION:  1.  Moderate left ventricular dysfunction with inferior apical and inferior      basilar hypokinesia.  2.  Persistently patent stent in the left main coronary artery.  3.  Satisfactory perfusion in the left anterior descending to the native      circulation, satisfactory circulation in the left circumflex system by      the saphenous vein graft sequentially to obtuse marginal-2 and 3 and      satisfactory circulation to the right coronary artery distribution by      way of the saphenous vein graft to the posterior descending artery.   DISCUSSION:  It was felt that Mr. Phillippi had satisfactory revascularization  at this point time. This does not explain his chest pain syndrome but we  will look for other noncardiac etiologies.      Colleen Can. Deborah Chalk, M.D.  Electronically Signed     SNT/MEDQ  D:  03/07/2005  T:  03/07/2005  Job:  [id number]

## 2010-09-30 NOTE — Discharge Summary (Signed)
NAMESHAKIM, FAITH                          ACCOUNT NO.:  [id number]   MEDICAL RECORD NO.:  [id number]                   PATIENT TYPE:  OIB   LOCATION:  6525                                 FACILITY:  [facility]   PHYSICIAN:  Colleen Can. Deborah Chalk, M.D.            DATE OF BIRTH:  [birth year]   DATE OF ADMISSION:  01/20/2003  DATE OF DISCHARGE:  01/21/2003                                 DISCHARGE SUMMARY   PRIMARY DISCHARGE DIAGNOSIS:  Abnormal stress Cardiolite study with anterior  apical perfusion defect and equivocal ischemia with subsequent elective  cardiac catheterization with percutaneous coronary intervention to the left  main coronary artery.   SECONDARY DIAGNOSES:  1. Atherosclerotic cardiovascular disease with previous history of anterior     myocardial infarction in July 1988 with subsequent coronary artery bypass     grafting x4 in 1989.  2. Hyperlipidemia.  3. Fatigue.  4. Situational stress.   HISTORY OF PRESENT ILLNESS:  The patient is a very pleasant 77 year old male  who has known coronary disease. He presented for his regular followup  appointment towards the mid part of July and was referred for stress  Cardiolite study. Clinically, he had done well without complaints of chest  pain or shortness of breath. His Cardiolite was performed on August 24 which  showed an anterior apical perfusion defect. There was equivocal ischemia. In  light of these findings, it was felt that he needed to undergo repeat  cardiac catheterization.   Please see the dictated history and physical for further patient  presentation and profile.   LABORATORY DATA:  Chemistries were normal. PT and PTT were unremarkable. CBC  was normal as well.   HOSPITAL COURSE:  The patient was admitted in order to undergo elective  cardiac catheterization. That procedure was performed without any noted  complications. The left main demonstrated an 85% distal narrowing. There  were diffuse 50 to 60%  narrowing in the left anterior descending. The LIMA  to the LAD is atretic. The left circumflex has ostial calcification. There  is a 95% narrowing in a second obtuse marginal branch with a greater than  90% narrowing in the third obtuse marginal branch. The vein graft to the OM2  and OM3 is patent. The right coronary artery is 100% occluded. The vein  graft to the PDA is unremarkable.   Subsequent PCI was performed to the left main with subsequent angioplasty  initially with Cutting balloon and subsequent 3.5 x 13 mm Cypher stent  placement, and overall satisfactory results were obtained, and today on  January 21, 2003, he is doing well without complaints, and he is felt to be  stable candidate for discharge.   DISCHARGE CONDITION:  Stable.   DISCHARGE MEDICATIONS:  He will resume all of his previous home medications  which include:  1. Zetia.  2. Zocor.  3. Multivitamin.  4.     Tenormin.  5.  I will add Plavix 75 mg a day for the next six months.   We will see him back in the office in approximately two weeks, sooner if  problems would arise.      Juanell Fairly C. Earl Gala, N.P.                 Colleen Can. Deborah Chalk, M.D.    LCO/MEDQ  D:  01/21/2003  T:  01/21/2003  Job:  [id number]

## 2010-09-30 NOTE — H&P (Signed)
[name], [name]                ACCOUNT NO.:  [id number]   MEDICAL RECORD NO.:  [id number]          PATIENT TYPE:  INP   LOCATION:  2901                         FACILITY:  [facility]   PHYSICIAN:  Colleen Can. Deborah Chalk, M.D.DATE OF BIRTH:  1923/09/20   DATE OF ADMISSION:  03/06/2005  DATE OF DISCHARGE:                                HISTORY & PHYSICAL   CHIEF COMPLAINT:  Recurrent chest pain.   HISTORY OF PRESENT ILLNESS:  The patient is a very pleasant 77 year old  male.  He has a known history of ischemic heart disease with previous  coronary artery bypass grafting as well as previous myocardial infarction.  He had his last catheterization approximately two years ago.  He was seen in  the office this past Friday for his routine follow-up appointment.  At that  time he was felt to be stable.  However, over the course of the weekend he  began to develop recurrent episodes of angina that was responsive to  nitroglycerin.  He took a total of four nitroglycerin on Sunday, one on the  day of admission.  His discomfort occurs mid sternally and spreads to both  sides.  He has been somewhat nauseated and diaphoretic.  He has not been  short of breath.  He was subsequently seen in the emergency room and  admitted for further evaluation.   PAST MEDICAL HISTORY:  1.  Anterior MI in 1988.  He was treated with PCI to the LAD.  He      subsequently underwent coronary artery bypass grafting x4 in 1989 with      LIMA to the LAD, saphenous vein graft to the OM2 and 3 and a saphenous      vein graft to the PDA.  He had his last catheterization with stent      placement to the left main in September of 2004.  At that time saphenous      vein grafts were noted to be patent.  The left internal mammary graft is      atretic.  Ejection fraction was 45%.  2.  Hyperlipidemia.  3.  LV dysfunction.  4.  Remote tobacco abuse.  5.  History of prostate cancer on Lupron.  6.  History of lithotripsy.   ALLERGIES:  None.   MEDICATIONS:  1.  Aspirin.  2.  Vytorin 10/80.  3.  Tenormin 25 mg.  4.  Multivitamin daily.   FAMILY HISTORY:  Positive for coronary disease.   SOCIAL HISTORY:  He is married.  He has had no current tobacco use and no  alcohol.  He cares for his invalid wife.   REVIEW OF SYSTEMS:  Basically as noted above and is otherwise unremarkable.   PHYSICAL EXAMINATION:  GENERAL:  He is currently in no acute distress.  He  is comfortable.  VITAL SIGNS:  Initially 187/87 and then down to 124/71.  He is afebrile.  Heart rate is in the 60s-70s.  SKIN:  Warm and dry.  Color is somewhat pale.  LUNGS:  Clear.  HEART:  Regular rhythm.  ABDOMEN:  Soft, nontender with  no masses.  EXTREMITIES:  Without edema.  NEUROLOGIC:  Intact.  There are no gross focal deficits.   LABORATORIES:  Currently pending.   OVERALL IMPRESSION:  1.  Unstable angina.  2.  Known ischemic heart disease with previous myocardial infarction and      subsequent coronary artery bypass grafting with most recent stent      placement to the left main in September of 2004.  3.  Hyperlipidemia.  4.  Past tobacco abuse.   PLAN:  He is admitted to the stepdown unit where cardiac enzymes will be  checked appropriately.  He will be placed on IV nitroglycerin and heparin.  Will make plans for him to undergo repeat catheterization on Tuesday  afternoon.      Sharlee Blew, N.P.      Colleen Can. Deborah Chalk, M.D.  Electronically Signed    LC/MEDQ  D:  03/07/2005  T:  03/07/2005  Job:  [id number]

## 2011-01-14 ENCOUNTER — Emergency Department (HOSPITAL_COMMUNITY): Payer: Medicare Other

## 2011-01-14 ENCOUNTER — Inpatient Hospital Stay (HOSPITAL_COMMUNITY)
Admission: EM | Admit: 2011-01-14 | Discharge: 2011-01-15 | DRG: 607 | Disposition: A | Discharge status: Home / Self Care | Payer: Medicare Other | Attending: Internal Medicine | Admitting: Internal Medicine

## 2011-01-14 DIAGNOSIS — Z8546 Personal history of malignant neoplasm of prostate: Secondary | ICD-10-CM

## 2011-01-14 DIAGNOSIS — I2510 Atherosclerotic heart disease of native coronary artery without angina pectoris: Secondary | ICD-10-CM | POA: Diagnosis present

## 2011-01-14 DIAGNOSIS — R42 Dizziness and giddiness: Secondary | ICD-10-CM | POA: Diagnosis present

## 2011-01-14 DIAGNOSIS — D72829 Elevated white blood cell count, unspecified: Secondary | ICD-10-CM | POA: Diagnosis present

## 2011-01-14 DIAGNOSIS — R61 Generalized hyperhidrosis: Principal | ICD-10-CM | POA: Diagnosis present

## 2011-01-14 DIAGNOSIS — E785 Hyperlipidemia, unspecified: Secondary | ICD-10-CM | POA: Diagnosis present

## 2011-01-14 DIAGNOSIS — I10 Essential (primary) hypertension: Secondary | ICD-10-CM | POA: Diagnosis present

## 2011-01-14 DIAGNOSIS — Z951 Presence of aortocoronary bypass graft: Secondary | ICD-10-CM

## 2011-01-14 DIAGNOSIS — I252 Old myocardial infarction: Secondary | ICD-10-CM

## 2011-01-14 DIAGNOSIS — Z8551 Personal history of malignant neoplasm of bladder: Secondary | ICD-10-CM

## 2011-01-14 LAB — BASIC METABOLIC PANEL
BUN: 18 mg/dL (ref 6–23)
CO2: 26 mEq/L (ref 19–32)
Calcium: 10 mg/dL (ref 8.4–10.5)
Chloride: 104 mEq/L (ref 96–112)
Creatinine, Ser: 1.02 mg/dL (ref 0.50–1.35)
GFR calc Af Amer: >60 mL/min (ref >60)
GFR calc non Af Amer: >60 mL/min (ref >60)
Glucose, Bld: 139 mg/dL — ABNORMAL HIGH (ref 70–99)
Potassium: 4.3 mEq/L (ref 3.5–5.1)
Sodium: 141 mEq/L (ref 135–145)

## 2011-01-14 LAB — DIFFERENTIAL
Basophils Absolute: 0 10*3/uL (ref 0.0–0.1)
Basophils Relative: 0 % (ref 0–1)
Eosinophils Absolute: 0 10*3/uL (ref 0.0–0.7)
Eosinophils Relative: 0 % (ref 0–5)
Lymphocytes Relative: 11 % — ABNORMAL LOW (ref 12–46)
Lymphs Abs: 1.3 10*3/uL (ref 0.7–4.0)
Monocytes Absolute: 0.3 10*3/uL (ref 0.1–1.0)
Monocytes Relative: 3 % (ref 3–12)
Neutro Abs: 10.3 10*3/uL — ABNORMAL HIGH (ref 1.7–7.7)
Neutrophils Relative %: 86 % — ABNORMAL HIGH (ref 43–77)

## 2011-01-14 LAB — CBC
HCT: 45.7 % (ref 39.0–52.0)
Hemoglobin: 15.6 g/dL (ref 13.0–17.0)
MCH: 32.2 pg (ref 26.0–34.0)
MCHC: 34.1 g/dL (ref 30.0–36.0)
MCV: 94.4 fL (ref 78.0–100.0)
Platelets: 190 10*3/uL (ref 150–400)
RBC: 4.84 MIL/uL (ref 4.22–5.81)
RDW: 13 % (ref 11.5–15.5)
WBC: 12 10*3/uL — ABNORMAL HIGH (ref 4.0–10.5)

## 2011-01-14 LAB — POCT I-STAT TROPONIN I
Troponin i, poc: 0 ng/mL (ref 0.00–0.08)
Troponin i, poc: 0.01 ng/mL (ref 0.00–0.08)

## 2011-01-15 ENCOUNTER — Encounter: Payer: Self-pay | Admitting: Internal Medicine

## 2011-01-15 DIAGNOSIS — R61 Generalized hyperhidrosis: Secondary | ICD-10-CM

## 2011-01-15 LAB — URINALYSIS, ROUTINE W REFLEX MICROSCOPIC
Bilirubin Urine: NEGATIVE
Glucose, UA: NEGATIVE mg/dL
Hgb urine dipstick: NEGATIVE
Ketones, ur: NEGATIVE mg/dL
Leukocytes, UA: NEGATIVE
Nitrite: NEGATIVE
Protein, ur: NEGATIVE mg/dL
Specific Gravity, Urine: 1.027 (ref 1.005–1.030)
Urobilinogen, UA: 0.2 mg/dL (ref 0.0–1.0)
pH: 5.5 (ref 5.0–8.0)

## 2011-01-15 LAB — HEMOGLOBIN A1C
Hgb A1c MFr Bld: 6 % — ABNORMAL HIGH (ref <5.7)
Mean Plasma Glucose: 126 mg/dL — ABNORMAL HIGH (ref <117)

## 2011-01-15 LAB — CBC
HCT: 42.5 % (ref 39.0–52.0)
Hemoglobin: 14.5 g/dL (ref 13.0–17.0)
MCH: 32.1 pg (ref 26.0–34.0)
MCHC: 34.1 g/dL (ref 30.0–36.0)
MCV: 94 fL (ref 78.0–100.0)
Platelets: 178 10*3/uL (ref 150–400)
RBC: 4.52 MIL/uL (ref 4.22–5.81)
RDW: 13.1 % (ref 11.5–15.5)
WBC: 9.1 10*3/uL (ref 4.0–10.5)

## 2011-01-15 LAB — COMPREHENSIVE METABOLIC PANEL
ALT: 14 U/L (ref 0–53)
AST: 24 U/L (ref 0–37)
Albumin: 3.4 g/dL — ABNORMAL LOW (ref 3.5–5.2)
Alkaline Phosphatase: 76 U/L (ref 39–117)
BUN: 19 mg/dL (ref 6–23)
CO2: 27 mEq/L (ref 19–32)
Calcium: 9.7 mg/dL (ref 8.4–10.5)
Chloride: 107 mEq/L (ref 96–112)
Creatinine, Ser: 1.07 mg/dL (ref 0.50–1.35)
GFR calc Af Amer: >60 mL/min (ref >60)
GFR calc non Af Amer: >60 mL/min (ref >60)
Glucose, Bld: 121 mg/dL — ABNORMAL HIGH (ref 70–99)
Potassium: 3.6 mEq/L (ref 3.5–5.1)
Sodium: 143 mEq/L (ref 135–145)
Total Bilirubin: 1.1 mg/dL (ref 0.3–1.2)
Total Protein: 6.7 g/dL (ref 6.0–8.3)

## 2011-01-15 LAB — CK TOTAL AND CKMB (NOT AT ARMC)
CK, MB: 2.7 ng/mL (ref 0.3–4.0)
Relative Index: INVALID (ref 0.0–2.5)
Total CK: 72 U/L (ref 7–232)

## 2011-01-15 LAB — CARDIAC PANEL(CRET KIN+CKTOT+MB+TROPI)
CK, MB: 3.1 ng/mL (ref 0.3–4.0)
Relative Index: INVALID (ref 0.0–2.5)
Total CK: 74 U/L (ref 7–232)
Troponin I: <0.3 ng/mL (ref <0.30)

## 2011-01-15 LAB — TROPONIN I: Troponin I: <0.3 ng/mL (ref <0.30)

## 2011-01-15 LAB — TSH: TSH: 1.178 u[IU]/mL (ref 0.350–4.500)

## 2011-01-15 NOTE — H&P (Signed)
Hospital Admission Note Date: 01/15/2011  Patient name: [name] Medical record number: [id number] Date of birth: [birth year] Age: 77 y.o. Gender: male PCP: No primary provider on file.  Medical Service: Internal Medicine Teaching Service, B2  Attending physician:  Dr. Blanch Media    Resident [phone]): Dr. Carrolyn Meiers     Pager: [phone] Resident (R1): Dr. Dede Query                  Pager: [phone]  Chief Complaint: Diaphoresis, Sherlynn Stalls headedness  History of Present Illness: [name] is a pleasant 77 year old man with past history of CAD, hypertension, prostate cancer s/p external beam radiation and lupron therapy and bladder cancer status post TURBT comes to the ED with chief complaint of intermittent diaphoresis for about a year. Morning of 01/14/2011 he woke up and had ensure and while he was in kitchen, he started sweating profusely and drenched all his clothes and sweat and was feeling lightheaded and so lied down on the floor in kitchen. He was afraid of getting up due to risk of fall.  He has this sweating episodes almost every day for last one year which is not accompanied by nausea, headache, lightheadedness, palpitations, fever, chest pain, short of breath. He also complains of change in color of skin which turns pale and flushed intermittently and is noticed by his friends for about a year now. He also complains of intermittent watery diarrhea about 2-3 times a day but no blood in the stool. He denies any weakness, tremors, change in vision, change in sensations, dizziness.  Meds: Current Outpatient Prescriptions  Medication Sig Dispense Refill  . amLODipine (NORVASC) 2.5 MG tablet Take 2.5 mg by mouth daily.        Marland Kitchen aspirin 81 MG EC tablet Take 81 mg by mouth as needed.        Marland Kitchen atenolol (TENORMIN) 50 MG tablet Take 50 mg by mouth daily.              Marland Kitchen lovastatin (MEVACOR) 40 MG tablet Take 40 mg by mouth at bedtime.         Allergies: Review of patient's allergies  indicates no known allergies.  Past Medical History  Diagnosis Date  . Coronary artery disease     Remote CABG in 1988 with last cath in 2006, last stress test in 2010  . Hyperkalemia     with ACE inhibitor  . Hypertension     controlled with Amlodipine  . Hyperlipidemia   . History of nephrolithiasis   . Myocardial infarction     July 1988/ Anterior  . Prostate cancer     treated with radiation in 2000  . History of bladder cancer   . Kidney stones    Past Surgical History  Procedure Date  . Cystoscopy w/ ureteral stent placement 03/11/08  . Appendectomy   . Lithotripsy     right sided  . Coronary artery bypass graft     01-05-88  . Cardiac catheterization 03/07/05  . Coronary angioplasty 01/20/03    left main stent    Family History  Problem Relation Age of Onset  . Heart attack Mother   . Heart disease Father   . Coronary artery disease Sister    History   Social History  . Marital Status: Widowed    Spouse Name: N/A    Number of Children: N/A  . Years of Education: 12th grade   Occupational History  .  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  Has no children. At present lives alone and has a friend who comes to visit him 3 times a week.   Social History Main Topics  . Smoking status: Former Smoker    Quit date: 05/15/1985  . Smokeless tobacco: Never Used  . Alcohol Use: No  . Drug Use: Not on file  . Sexually Active: Not on file   Other Topics Concern  . Not on file   Social History Narrative  . No narrative on file    Review of Systems: As per history of present illness, all other systems reviewed and negative.  Physical Exam: Vitals: T: 97.9   HR: 72   BP: 142/60   RR: 15   O2 saturation: 96% on RA  General: resting in bed HEENT: PERRL, EOMI, no scleral icterus Cardiac: S1 S2,RRR, no rubs, murmurs or gallops Pulm: clear to auscultation bilaterally, moving normal volumes of air Abd: soft,  nontender, nondistended, BS present Ext: warm and well perfused, no pedal edema Neuro: alert and oriented X3, cranial nerves II-XII grossly intact, strength and sensation to light touch equal in bilateral upper and lower extremities   Lab results: Basic Metabolic Panel: Recent Labs  [facility] 01/14/11 1852   NA 141   K 4.3   CL 104   CO2 26   GLUCOSE 139*   BUN 18   CREATININE 1.02   CALCIUM 10.0   MG --   PHOS --    CBC: Recent Labs  [facility] 01/14/11 1852   WBC 12.0*   NEUTROABS 10.3*   HGB 15.6   HCT 45.7   MCV 94.4   PLT 190   Cardiac Enzymes: . Troponin I, POC                          0.01              0.00-0.08        ng/mL  Imaging results:    CHEST - 2 VIEW     IMPRESSION: No active disease.  No significant change.   Other results: EKG: Sinus bradycardia, no new ST-T wave changes.  Assessment & Plan by Problem: 1) Diaphoresis/lightheadedness/flushing: Considering the history of present illness, physical exam and past history, patient's diaphoresis and flushing episodes seems to have broad differential diagnosis including autonomic dysfunction, carcinoid syndrome, pheochromocytoma, metastatic cancer (considering history of bladder and prostate cancer), hypoglycemic events, hyperthyroidism. - Will admit to telemetry bed. - Will check TSH and HbA1c. -Will check 24-hour urine metanephrines (pheochromocytoma) and 5 HIAA (carcinoid). - Orthostatic vitals. - CMP in a.m.  2) Leukocytosis: No signs of infection. Unknown etiology. May be reactive leukocytosis to stress. - CBC in a.m.  3) CAD: Status post CABG in 1989 and stent in 2004. Troponin x1 negative in ED. -We'll check cardiac enzymes x2. - Continue  Aspirin, lovastatin, atenolol. - 12 and EKG in a.m.  4) Hypertension: Blood pressure is in normal range. Continue Norvasc along with atenolol.  5) DVT prophylaxis: Lovenox  R2/3______________________________       R1________________________________  ATTENDING: I performed and/or observed a history and physical examination of the patient.  I discussed the case with the residents as noted and reviewed the residents' notes.  I agree with the findings and plan--please refer to the attending physician note for more details.  Signature________________________________  Printed Name_____________________________

## 2011-01-17 ENCOUNTER — Telehealth: Payer: Self-pay | Admitting: Nurse Practitioner

## 2011-01-17 NOTE — Telephone Encounter (Signed)
Pt was in hospital this past weekend for sweating and was told to see his PCP in [place], Dr. Lesly Rubenstein.  Pt would like to also speak with Lawson Fiscal about his visit this past weekend.  Chart in box.

## 2011-01-17 NOTE — Telephone Encounter (Signed)
Hospital information reviewed by [facility].  Called patient and advised and scheduled an appointment with Lawson Fiscal for 9/6.  States he has an appointment with Dr. Clarene Duke tomorrow. Advised if needed to be seen before Thursday to call back.

## 2011-01-19 ENCOUNTER — Encounter: Payer: Self-pay | Admitting: Nurse Practitioner

## 2011-01-19 ENCOUNTER — Ambulatory Visit (INDEPENDENT_AMBULATORY_CARE_PROVIDER_SITE_OTHER): Payer: Medicare Other | Admitting: Nurse Practitioner

## 2011-01-19 ENCOUNTER — Other Ambulatory Visit: Payer: Self-pay | Admitting: Family Medicine

## 2011-01-19 ENCOUNTER — Ambulatory Visit
Admission: RE | Admit: 2011-01-19 | Discharge: 2011-01-19 | Disposition: A | Discharge status: Home / Self Care | Payer: Medicare Other | Source: Ambulatory Visit | Attending: Family Medicine | Admitting: Family Medicine

## 2011-01-19 DIAGNOSIS — I2510 Atherosclerotic heart disease of native coronary artery without angina pectoris: Secondary | ICD-10-CM

## 2011-01-19 DIAGNOSIS — R0689 Other abnormalities of breathing: Secondary | ICD-10-CM

## 2011-01-19 DIAGNOSIS — R55 Syncope and collapse: Secondary | ICD-10-CM

## 2011-01-19 DIAGNOSIS — I10 Essential (primary) hypertension: Secondary | ICD-10-CM

## 2011-01-19 DIAGNOSIS — R9389 Abnormal findings on diagnostic imaging of other specified body structures: Secondary | ICD-10-CM

## 2011-01-19 DIAGNOSIS — R918 Other nonspecific abnormal finding of lung field: Secondary | ICD-10-CM

## 2011-01-19 NOTE — Assessment & Plan Note (Signed)
We may need to update his myoview. We will see what his CT and holter show.

## 2011-01-19 NOTE — Patient Instructions (Signed)
We are going to put a monitor on you to look at your heart rate for 24 hours. I want you to try some orange juice the next time you have a spell and see if this helps We will see what the CT scan shows. If this is all ok, we will repeat your stress test.

## 2011-01-19 NOTE — Assessment & Plan Note (Addendum)
He is having some presyncopal spells. Sounds more like hypoglycemia. Further evaluation is in process. I have placed a Holter to make sure it is not rate or arrhythmia related. If his studies are ok he is agreeable to repeat stress testing. Patient is agreeable to this plan and will call if any problems develop in the interim.    Holter shows mean heart rate of 61, maximum of 101 and minimum of 41. His longest pause ws 1.64. He is on atenolol. Will send to Dr. Alford Highland attention.

## 2011-01-19 NOTE — Discharge Summary (Signed)
[name], [name]                ACCOUNT NO.:  [id number]  MEDICAL RECORD NO.:  [id number]  LOCATION:                                 FACILITY:   PHYSICIAN:  C. Ulyess Mort, M.D. DATE OF BIRTH:  1924-03-16  DATE OF ADMISSION:  01/14/2011 DATE OF DISCHARGE:  01/15/2011                              DISCHARGE SUMMARY   PRIMARY CARE PHYSICIAN:  Aida Puffer, MD  DISCHARGE DIAGNOSES: 1. Diaphoresis/lightheadedness/flushing.  Unclear etiologies.  To be     followed up by the patient's PCP, Dr. Aida Puffer in the     outpatient setting. 2. Coronary artery disease.  Remote CABG in 1988 with      last cath in 2006, last stress test in 2010. 3. History of myocardial infarction, July 1988/anterior. 4. Hypertension, controlled with amlodipine. 5. Hyperlipidemia, controlled with lovastatin. 6. History of hyperkalemia secondary to ACE inhibitor. 7. History of nephrolithiasis. 8. History of left pyelonephritis. 9. History of prostate cancer, treated with radiation in 2000. 10.History of bladder cancer.  DISCHARGE MEDICATIONS: 1. Amlodipine 2.5 mg 1 tablet p.o. daily. 2. Aspirin enteric coated 81 mg p.o. daily. 3. Atenolol 50 mg p.o. daily. 4. Eye vitamin OTC 1 tablet p.o. daily. 5. Lovastatin 40 mg p.o. daily.  DISPOSITION AND FOLLOWUP: 1. PCP, Dr. Aida Puffer, [phone].  The patient will call     and make appointment with Dr. Clarene Duke on Tuesday,01/17/11. 2. [facility], the patient has an appointment this month.  PROCEDURES:  None.  CONSULTATIONS:  None.  BRIEF ADMITTING HISTORY AND PHYSICAL AND LABS:   [name] is a pleasant 77 year old man with past history of CAD,  hypertension, prostate cancer s/p external beam radiation and  lupron therapy and bladder cancer status post TURBT comes to the ED  with chief complaint of intermittent diaphoresis for about a year. Morning of 01/14/2011 he woke up and had ensure and while he was in kitchen,  he started  sweating profusely and drenched all his clothes and sweat a nd was feeling lightheaded and so lied down on the floor in kitchen.  He was afraid of getting up due to risk of fall.  He has this sweating episodes  almost every day for last one year which is not accompanied by nausea, headache,  lightheadedness, palpitations, fever, chest pain, short of breath. He also complains of change in color of skin which turns pale and flushed  intermittently and is noticed by his friends for about a year now. He also complains of intermittent watery diarrhea about 2-3 times a day  but no blood in the stool.He denies any weakness, tremors, change in vision,  change in sensations, dizziness.   PHYSICAL EXAMINATION:   VITAL SIGNS:  T 97.9, HR 72, BP 142/60, RR 15, O2 sat 96% on RA. General: resting in bed HEENT: PERRL, EOMI, no scleral icterus Cardiac: S1 S2,RRR, no rubs, murmurs or gallops Pulm: clear to auscultation bilaterally, moving normal volumes of air Abd: soft, nontender, nondistended, BS present Ext: warm and well perfused, no pedal edema Neuro: alert and oriented X3, cranial nerves II-XII grossly intact,  strength and sensation to light touch equal in bilateral upper and  lower extremities  LABORATORY DATA:  WBC 12, HGB 15.6, HCT 45.7, PLT 190. Na 141, K 4.3, Cl 104, CO2 26, BG 139, BUN 18, CR 1.02. Cardiac enzymes negative x3.UA negative. Chest x-ray indicates no active disease.  HOSPITAL COURSE:   1. Diaphoresis/lightheadedness/flushing, unclear etiologies. This patient is an 77 year old Caucasian male admitted male admitted for chrnic sweating spells x1 year and recent dizziness.  He has a primary care physician, Dr. Aida Puffer in [place], [place], whom he has seen on a regular basis.  However, he states that he has never mentioned these symptoms to Dr. Clarene Duke.  His physical examinations are essentially benign.  CBC, BMP, CK and troponin, and chest x-ray are negative.  I do not see any  need for an inpatient workup after discussion with Dr. Ulyess Mort and Dr. Carrolyn Meiers.  I have talked to the patient in length about following up with Dr. Clarene Duke and having workup done at outpatient setting.  I will call Dr. Clarene Duke on Tuesday.  His other chronic medical problems are stable.  DISCHARGE VITALS AND LABS:   Vitals T 97.6, BP 131/65, P 61, RR 14. WBC 9.1, HGB 14.5, HCT 42.5, PLT 178. Na 143, K 3.6, Cl 107, CO2 27, BG 121, BUN 19, CR 1.07. TSH 1.178 (0.35-4.5). Hemoglobin A1c 6.0.    ______________________________ Douglas Query, MD   ______________________________ C. Ulyess Mort, M.D.    NL/MEDQ  D:  01/15/2011  T:  01/16/2011  Job:  [id number]  cc:   Aida Puffer  Electronically Signed by Douglas Query MD on 01/18/2011 04:18:58 PM Electronically Signed by [name] M.D. on 01/19/2011 10:54:34 AM

## 2011-01-19 NOTE — Progress Notes (Signed)
[name] Date of Birth: [birth year]   History of Present Illness: [name] is seen today for a follow up post hospital visit. He is seen for [name]. He is a former patient of Dr. [name]. He was hospitalized last weekend with a spell of diaphoresis and feeling clammy. He had to lie in the floor. He did not pass out. He felt like he might. No chest pain or palpitations. No shortness of breath. These spells have been occuring for over a year. They are getting worse and lasting longer.No pattern that he can identify. He has not tried eating or drinking juice during a spell.  His hospital stay was unremarkable. He has been back to his PCP. He will be having a follow up CT of the chest and CXR later this morning. He has had abnormal CT scans in the past. His last nuclear was in 2010.   Current Outpatient Prescriptions on File Prior to Visit  Medication Sig Dispense Refill  . amLODipine (NORVASC) 2.5 MG tablet Take 2.5 mg by mouth daily.        Marland Kitchen aspirin 81 MG EC tablet Take 81 mg by mouth as needed.        Marland Kitchen atenolol (TENORMIN) 50 MG tablet Take 50 mg by mouth daily.        . ergocalciferol (VITAMIN D2) 50000 UNIT capsule Take 50,000 Units by mouth as needed. Every 3 months       . lovastatin (MEVACOR) 40 MG tablet Take 40 mg by mouth at bedtime.          No Known Allergies  Past Medical History  Diagnosis Date  . Coronary artery disease     Remote CABG in 1988 with last cath in 2006, last stress test in 2010  . Hyperkalemia     with ACE inhibitor  . Hypertension     controlled with Amlodipine  . Hyperlipidemia   . History of nephrolithiasis   . Myocardial infarction     July 1988/ Anterior MI with CABG in 1989  . Prostate cancer     treated with radiation in 2000  . History of bladder cancer   . Kidney stones   . Abnormal nuclear cardiac imaging test 2010    with apical akinesia with prior infarct. No ischemia    Past Surgical History  Procedure Date  . Cystoscopy w/  ureteral stent placement 03/11/08  . Appendectomy   . Lithotripsy     right sided  . Coronary artery bypass graft     01-05-88  . Cardiac catheterization 03/07/05  . Coronary angioplasty 01/20/03    left main stent     History  Smoking status  . Former Smoker  . Quit date: 05/15/1985  Smokeless tobacco  . Never Used    History  Alcohol Use No    Family History  Problem Relation Age of Onset  . Heart attack Mother   . Heart disease Father   . Coronary artery disease Sister     Review of Systems: The review of systems is as above.  All other systems were reviewed and are negative.  Physical Exam: BP 126/76  Pulse 60  Wt 145 lb (65.772 kg) Patient is very pleasant and in no acute distress. Skin is warm and dry. Color is normal.  HEENT is unremarkable. Normocephalic/atraumatic. PERRL. Sclera are nonicteric. Neck is supple. No masses. No JVD. Lungs are clear. Cardiac exam shows a regular rate and rhythm. Abdomen is soft.  Extremities are without edema. Gait and ROM are intact. No gross neurologic deficits noted.   LABORATORY DATA:   Assessment / Plan:

## 2011-01-19 NOTE — Assessment & Plan Note (Signed)
Blood pressure is ok. No change in his medicines.  

## 2011-01-23 NOTE — Progress Notes (Signed)
Agree with note.  [name]  

## 2011-01-25 ENCOUNTER — Telehealth: Payer: Self-pay | Admitting: *Deleted

## 2011-01-25 DIAGNOSIS — J8410 Pulmonary fibrosis, unspecified: Secondary | ICD-10-CM | POA: Insufficient documentation

## 2011-01-25 NOTE — Assessment & Plan Note (Addendum)
He has had a CXR ordered by Dr. Clarene Duke. The report was reviewed in [facility]. He still has this nodule in the right lung. CT scan was felt to be warranted. I was under the impression that [name] was going to have the CT last week at the time of his CXR but this does not appear to have happened. I have spoken to [facility] by phone today on September 12th. He is not able to tell me if he had the test or not. He is not interested in further follow up and would not schedule his follow up appointment. He says he has been seeing too many people.

## 2011-01-25 NOTE — Telephone Encounter (Signed)
Sunday Spillers called pt to notify of holter monitor results and to make a follow up app to see her next week. He didn't want to make an app now. Is scheduled to see [name] in Nov.

## 2011-02-02 ENCOUNTER — Telehealth: Payer: Self-pay | Admitting: Nurse Practitioner

## 2011-02-02 ENCOUNTER — Telehealth: Payer: Self-pay | Admitting: *Deleted

## 2011-02-02 NOTE — Telephone Encounter (Signed)
Dr Shirlee Latch reviewed monitor done 01/19/11 at [facility]. Mild bradycardia,no concerning event. I discussed results with pt. Original report signed by Dr Shirlee Latch forwarded to HIM.

## 2011-02-02 NOTE — Telephone Encounter (Signed)
Received confirmation that request went through.  Request complete.

## 2011-02-02 NOTE — Telephone Encounter (Signed)
Received Auth to Release Med Rec Info to [facility] office of Dr. Delrae Rend.  Faxed last 3 years of OV Notes, Labs, EKG's and Echo's available today.

## 2011-02-09 ENCOUNTER — Encounter (HOSPITAL_COMMUNITY): Payer: Self-pay | Admitting: Radiology

## 2011-02-09 ENCOUNTER — Ambulatory Visit (HOSPITAL_COMMUNITY): Payer: Medicare Other

## 2011-02-09 ENCOUNTER — Ambulatory Visit (HOSPITAL_COMMUNITY)
Admission: RE | Admit: 2011-02-09 | Discharge: 2011-02-09 | Disposition: A | Discharge status: Home / Self Care | Payer: Medicare Other | Source: Ambulatory Visit | Attending: Internal Medicine | Admitting: Internal Medicine

## 2011-02-09 DIAGNOSIS — I10 Essential (primary) hypertension: Secondary | ICD-10-CM | POA: Insufficient documentation

## 2011-02-09 DIAGNOSIS — Z8546 Personal history of malignant neoplasm of prostate: Secondary | ICD-10-CM | POA: Insufficient documentation

## 2011-02-09 DIAGNOSIS — I2510 Atherosclerotic heart disease of native coronary artery without angina pectoris: Secondary | ICD-10-CM | POA: Insufficient documentation

## 2011-02-09 DIAGNOSIS — R9389 Abnormal findings on diagnostic imaging of other specified body structures: Secondary | ICD-10-CM

## 2011-02-09 DIAGNOSIS — R55 Syncope and collapse: Secondary | ICD-10-CM | POA: Insufficient documentation

## 2011-02-09 DIAGNOSIS — R918 Other nonspecific abnormal finding of lung field: Secondary | ICD-10-CM | POA: Insufficient documentation

## 2011-02-09 DIAGNOSIS — Z5309 Procedure and treatment not carried out because of other contraindication: Secondary | ICD-10-CM | POA: Insufficient documentation

## 2011-02-09 DIAGNOSIS — Z01812 Encounter for preprocedural laboratory examination: Secondary | ICD-10-CM | POA: Insufficient documentation

## 2011-02-09 DIAGNOSIS — G9001 Carotid sinus syncope: Secondary | ICD-10-CM | POA: Insufficient documentation

## 2011-02-09 DIAGNOSIS — E785 Hyperlipidemia, unspecified: Secondary | ICD-10-CM | POA: Insufficient documentation

## 2011-02-09 DIAGNOSIS — Z951 Presence of aortocoronary bypass graft: Secondary | ICD-10-CM | POA: Insufficient documentation

## 2011-02-09 LAB — BASIC METABOLIC PANEL
Calcium: 10.2 mg/dL (ref 8.4–10.5)
Creatinine, Ser: 1.17 mg/dL (ref 0.50–1.35)
GFR calc Af Amer: >60 mL/min (ref >60)

## 2011-02-09 LAB — PROTIME-INR: INR: 0.94 (ref 0.00–1.49)

## 2011-02-09 LAB — CBC
Platelets: 193 10*3/uL (ref 150–400)
RDW: 12.7 % (ref 11.5–15.5)
WBC: 6.3 10*3/uL (ref 4.0–10.5)

## 2011-02-09 LAB — APTT: aPTT: 27 seconds (ref 24–37)

## 2011-02-09 LAB — SURGICAL PCR SCREEN: MRSA, PCR: NEGATIVE

## 2011-02-09 MED ORDER — IOHEXOL 300 MG/ML  SOLN: 100.0000 mL | Freq: Once | INTRAMUSCULAR | Status: DC | PRN

## 2011-02-14 LAB — URINALYSIS, ROUTINE W REFLEX MICROSCOPIC
Bilirubin Urine: NEGATIVE
Protein, ur: 100 — AB
Specific Gravity, Urine: 1.031 — ABNORMAL HIGH
Urobilinogen, UA: 0.2

## 2011-02-14 LAB — BASIC METABOLIC PANEL
BUN: 22
BUN: 26 — ABNORMAL HIGH
CO2: 24
CO2: 26
Calcium: 8.4
Calcium: 9
Chloride: 101
Chloride: 101
Chloride: 99
Creatinine, Ser: 1.43
GFR calc Af Amer: 57 — ABNORMAL LOW
GFR calc non Af Amer: 47 — ABNORMAL LOW
GFR calc non Af Amer: 59 — ABNORMAL LOW
Glucose, Bld: 118 — ABNORMAL HIGH
Glucose, Bld: 160 — ABNORMAL HIGH
Potassium: 3.8
Potassium: 4.3
Sodium: 132 — ABNORMAL LOW
Sodium: 134 — ABNORMAL LOW
Sodium: 134 — ABNORMAL LOW

## 2011-02-14 LAB — POCT I-STAT, CHEM 8
BUN: 12
Calcium, Ion: 1.25
Chloride: 102
Creatinine, Ser: 1.1
Glucose, Bld: 95
HCT: 48
Hemoglobin: 16.3
Potassium: 4.5
Sodium: 138
TCO2: 27

## 2011-02-14 LAB — DIFFERENTIAL
Basophils Relative: 0
Eosinophils Absolute: 0
Eosinophils Relative: 0
Lymphs Abs: 1
Monocytes Absolute: 0.5
Neutro Abs: 23.3 — ABNORMAL HIGH
Neutrophils Relative %: 94 — ABNORMAL HIGH

## 2011-02-14 LAB — CBC
HCT: 36.7 — ABNORMAL LOW
HCT: 39.3
Hemoglobin: 12.5 — ABNORMAL LOW
Hemoglobin: 12.6 — ABNORMAL LOW
Hemoglobin: 13.4
MCHC: 34.2
MCHC: 34.2
MCV: 96.6
MCV: 98.2
Platelets: 84 — ABNORMAL LOW
RBC: 3.74 — ABNORMAL LOW
RBC: 3.78 — ABNORMAL LOW
RDW: 13.4
RDW: 13.5
RDW: 13.6
WBC: 14.5 — ABNORMAL HIGH

## 2011-02-14 LAB — URINE MICROSCOPIC-ADD ON

## 2011-02-14 LAB — CULTURE, BLOOD (ROUTINE X 2): Culture: NO GROWTH

## 2011-02-14 LAB — URINE CULTURE: Culture: NO GROWTH

## 2011-02-17 LAB — BASIC METABOLIC PANEL
BUN: 16 mg/dL (ref 6–23)
Chloride: 101 mEq/L (ref 96–112)
GFR calc non Af Amer: >60 mL/min (ref >60)
Potassium: 4.4 mEq/L (ref 3.5–5.1)
Sodium: 136 mEq/L (ref 135–145)

## 2011-02-17 LAB — HEMOGLOBIN AND HEMATOCRIT, BLOOD
HCT: 38.5 % — ABNORMAL LOW (ref 39.0–52.0)
Hemoglobin: 12.6 g/dL — ABNORMAL LOW (ref 13.0–17.0)

## 2011-02-27 ENCOUNTER — Other Ambulatory Visit: Payer: Self-pay | Admitting: Cardiology

## 2011-02-27 DIAGNOSIS — E041 Nontoxic single thyroid nodule: Secondary | ICD-10-CM

## 2011-02-28 ENCOUNTER — Ambulatory Visit
Admission: RE | Admit: 2011-02-28 | Discharge: 2011-02-28 | Disposition: A | Discharge status: Home / Self Care | Payer: Medicare Other | Source: Ambulatory Visit | Attending: Cardiology | Admitting: Cardiology

## 2011-02-28 DIAGNOSIS — E041 Nontoxic single thyroid nodule: Secondary | ICD-10-CM

## 2011-03-02 ENCOUNTER — Encounter: Payer: Self-pay | Admitting: Critical Care Medicine

## 2011-03-02 ENCOUNTER — Ambulatory Visit (INDEPENDENT_AMBULATORY_CARE_PROVIDER_SITE_OTHER): Payer: Medicare Other | Admitting: Critical Care Medicine

## 2011-03-02 VITALS — BP 138/76 | HR 59 | Temp 97.8°F | Ht 67.5 in | Wt 147.0 lb

## 2011-03-02 DIAGNOSIS — R222 Localized swelling, mass and lump, trunk: Secondary | ICD-10-CM

## 2011-03-02 DIAGNOSIS — J438 Other emphysema: Secondary | ICD-10-CM

## 2011-03-02 DIAGNOSIS — J449 Chronic obstructive pulmonary disease, unspecified: Secondary | ICD-10-CM

## 2011-03-02 DIAGNOSIS — Z87442 Personal history of urinary calculi: Secondary | ICD-10-CM

## 2011-03-02 DIAGNOSIS — J4489 Other specified chronic obstructive pulmonary disease: Secondary | ICD-10-CM

## 2011-03-02 DIAGNOSIS — R918 Other nonspecific abnormal finding of lung field: Secondary | ICD-10-CM

## 2011-03-02 DIAGNOSIS — J439 Emphysema, unspecified: Secondary | ICD-10-CM

## 2011-03-02 DIAGNOSIS — R55 Syncope and collapse: Secondary | ICD-10-CM

## 2011-03-02 DIAGNOSIS — R9389 Abnormal findings on diagnostic imaging of other specified body structures: Secondary | ICD-10-CM

## 2011-03-02 MED ORDER — TIOTROPIUM BROMIDE MONOHYDRATE 18 MCG IN CAPS: 18.0000 ug | ORAL_CAPSULE | Freq: Every day | RESPIRATORY_TRACT | Status: DC

## 2011-03-02 NOTE — Progress Notes (Signed)
Subjective:    Patient ID: [name], male    DOB: [birth year], 77 y.o.   MRN: [id number]  HPI 77 y.o. M referred for lung mass  Episodes for three years, getting diaphoretic and clammy. Then 6 weeks ago spell of syncope, ? How long out? Awoke with soaking sweats. Admitted and w/u at [facility] "neg"  PCP saw the pt and referred to [facility].  Dr Clarene Duke obtained CT Chest ,  Nodule seen in the chest.  Then set up for PPM placement,  Pt went to hospital and the Dr Graciela Husbands , saw the pt and told pt he did not need the PPM,  3weeks ago.  So then pt put on a 30day event monitor.   Now no real cough, notes dyspnea on exertion on >450ft.  Notes some chest tightness. No recent spells of syncope . Notes some edema.  Notes occ indigestion.  No current smoker, since CABG in 72  Past Medical History  Diagnosis Date  . Coronary artery disease     Remote CABG in 1988 with last cath in 2006, last stress test in 2010  . Hyperkalemia     with ACE inhibitor  . Hypertension     controlled with Amlodipine  . Hyperlipidemia   . History of nephrolithiasis   . Myocardial infarction     July 1988/ Anterior MI with CABG in 1989  . Prostate cancer     treated with radiation in 2000  . History of bladder cancer   . Abnormal nuclear cardiac imaging test 2010    with apical akinesia with prior infarct. No ischemia  . History of heart attack      Family History  Problem Relation Age of Onset  . Heart attack Mother   . Heart disease Father   . Coronary artery disease Sister   . Asthma Brother   . Asthma Sister   . Emphysema Brother      History   Social History  . Marital Status: Widowed    Spouse Name: N/A    Number of Children: N/A  . Years of Education: N/A   Occupational History  . RETIRED     Plumbing work   Social History Main Topics  . Smoking status: Former Smoker -- 0.8 packs/day for 55 years    Types: Cigarettes    Quit date: 05/16/1987  . Smokeless tobacco: Never Used  . Alcohol Use: No    . Drug Use: Not on file  . Sexually Active: Not on file   Other Topics Concern  . Not on file   Social History Narrative  . No narrative on file     No Known Allergies   Outpatient Prescriptions Prior to Visit  Medication Sig Dispense Refill  . amLODipine (NORVASC) 2.5 MG tablet Take 2.5 mg by mouth daily.        Marland Kitchen aspirin 81 MG EC tablet Take 81 mg by mouth daily.       Marland Kitchen atenolol (TENORMIN) 50 MG tablet Take 50 mg by mouth daily.        . ergocalciferol (VITAMIN D2) 50000 UNIT capsule Take 50,000 Units by mouth as needed. Every 3 months       . lovastatin (MEVACOR) 40 MG tablet Take 40 mg by mouth at bedtime.           Review of Systems  Constitutional: Positive for chills and diaphoresis. Negative for fever, activity change, appetite change, fatigue and unexpected weight change.  HENT: Positive  for congestion, rhinorrhea and voice change. Negative for hearing loss, ear pain, nosebleeds, sore throat, facial swelling, sneezing, mouth sores, trouble swallowing, neck pain, neck stiffness, dental problem, postnasal drip, sinus pressure, tinnitus and ear discharge.   Eyes: Positive for visual disturbance. Negative for photophobia, discharge and itching.  Respiratory: Positive for chest tightness and shortness of breath. Negative for apnea, cough, choking, wheezing and stridor.   Cardiovascular: Negative for chest pain, palpitations and leg swelling.  Gastrointestinal: Negative for nausea, vomiting, abdominal pain, constipation, blood in stool and abdominal distention.  Genitourinary: Negative for dysuria, urgency, frequency, hematuria, flank pain, decreased urine volume and difficulty urinating.  Musculoskeletal: Positive for gait problem. Negative for myalgias, back pain, joint swelling and arthralgias.  Skin: Negative for color change, pallor and rash.  Neurological: Positive for syncope. Negative for dizziness, tremors, seizures, speech difficulty, weakness, light-headedness,  numbness and headaches.  Hematological: Negative for adenopathy. Does not bruise/bleed easily.  Psychiatric/Behavioral: Positive for sleep disturbance. Negative for confusion and agitation. The patient is not nervous/anxious.        Objective:   Physical Exam  Filed Vitals:   03/02/11 1010  BP: 138/76  Pulse: 59  Temp: 97.8 F (36.6 C)  TempSrc: Oral  Height: 5' 7.5" (1.715 m)  Weight: 147 lb (66.679 kg)  SpO2: 98%    Gen: Pleasant, well-nourished, in no distress,  normal affect  ENT: No lesions,  mouth clear,  oropharynx clear, no postnasal drip  Neck: No JVD, no TMG, no carotid bruits  Lungs: No use of accessory muscles, no dullness to percussion,distant BS  Cardiovascular: RRR, heart sounds normal, no murmur or gallops, no peripheral edema  Abdomen: soft and NT, no HSM,  BS normal  Musculoskeletal: No deformities, no cyanosis or clubbing  Neuro: alert, non focal  Skin: Warm, no lesions or rashes  CT chest 02/09/11 Findings: There is a large low density nodule within the right lobe  of the thyroid gland measuring 1.8 x 1.4 cm, image #8. This is new  from 2007.  No enlarged mediastinal or hilar lymph nodes.  No pericardial or pleural effusion.  Within the superior segment of the right lower lobe there is an  mass-like density measuring 3.9 x 4.0 cm, image 33. This mass has  irregular margins.  Stable small pulmonary nodule within the right upper lobe measuring  0.6 cm, image 24.  The left lung appears clear.  No airspace consolidation identified.  Review of the visualized osseous structures is significant for  osteopenia and thoracic spondylosis.  No worrisome lytic or sclerotic lesions identified.  There is a mild compression deformity involving the L1 vertebra.  This is unchanged from 01/19/2011.  No lytic or sclerotic lesions identified.  Limited imaging through the upper abdomen demonstrates multiple  cysts within the upper pole the left kidney.  The  adrenal glands both appear normal.  IMPRESSION:  1. Mass within the superior segment of the right lower lobe is  indeterminate. In the acute setting this could represent an area  of airspace consolidation secondary to pneumonia. If there are no  signs or symptoms of infection then tumor cannot be excluded.  Suggest further evaluation with PET CT.  2. Stable small pulmonary nodule in the right upper lobe compared  with 2007.  3. New nodule in the right lobe of the thyroid gland. Recommend  further evaluation with thyroid sonography.       Assessment & Plan:   Lung mass RLL lung mas 4x4cm in diameter, no  hilar or mediastinal LAN  ?malignancy Plan PET scan Electronavigational bronchoscopy per Dr Delton Coombes Aloha Gell within the next two weeks.  COPD (chronic obstructive pulmonary disease) Moderate COPD FeV1 53%  Fef 25 75 75%  Cleda Daub 03/02/11  Plan Start spiriva daily    Will need to be cleared first by Dr Jacinto Halim whom I spoke with this after noon.  He may need more work on a stent stenosing LAD lesion prior to the FOB.   Updated Medication List Outpatient Encounter Prescriptions as of 03/02/2011  Medication Sig Dispense Refill  . amLODipine (NORVASC) 2.5 MG tablet Take 2.5 mg by mouth daily.        Marland Kitchen aspirin 81 MG EC tablet Take 81 mg by mouth daily.       Marland Kitchen atenolol (TENORMIN) 50 MG tablet Take 50 mg by mouth daily.        . ergocalciferol (VITAMIN D2) 50000 UNIT capsule Take 50,000 Units by mouth as needed. Every 3 months       . lovastatin (MEVACOR) 40 MG tablet Take 40 mg by mouth at bedtime.        Marland Kitchen tiotropium (SPIRIVA HANDIHALER) 18 MCG inhalation capsule Place 1 capsule (18 mcg total) into inhaler and inhale daily.  30 capsule  6

## 2011-03-02 NOTE — Patient Instructions (Addendum)
Start Spiriva daily A PET scan will be obtained at [facility] A bronchoscopy will be done in the operating room at [facility] with Drs Delton Coombes and Aloha Gell, my partners using a special computer guided imaging system.  The date of this procedure is to be determined and to be done under general anesthesia No other medication changes Return after bronchoscopy is completed to [facility] in three weeks

## 2011-03-02 NOTE — Assessment & Plan Note (Signed)
Moderate COPD FeV1 53%  Fef 25 75 75%  Spiro 03/02/11  Plan Start spiriva daily

## 2011-03-02 NOTE — Assessment & Plan Note (Signed)
RLL lung mas 4x4cm in diameter, no hilar or mediastinal LAN  ?malignancy Plan PET scan Electronavigational bronchoscopy per Dr Delton Coombes Aloha Gell within the next two weeks.

## 2011-03-06 ENCOUNTER — Telehealth: Payer: Self-pay | Admitting: Critical Care Medicine

## 2011-03-06 ENCOUNTER — Encounter: Payer: Medicare Other | Admitting: Internal Medicine

## 2011-03-06 DIAGNOSIS — R918 Other nonspecific abnormal finding of lung field: Secondary | ICD-10-CM

## 2011-03-06 NOTE — Telephone Encounter (Signed)
I spoke with [name] and they are wanting to know if the date for the bronchoscopy has been set up yet. Please advise Dr. Delford Field, thanks  Carver Fila, CMA

## 2011-03-06 NOTE — Telephone Encounter (Signed)
Megan @ [facility] called for Crystal.  Please call back at [phone].  Antionette Fairy

## 2011-03-06 NOTE — Telephone Encounter (Signed)
Called [phone], spoke with [facility].  She will check on the status of this and call me back.  Will hold message in my box to follow up on.

## 2011-03-06 NOTE — Telephone Encounter (Signed)
Called, spoke with Lawson Fiscal - states Aundra Millet just stepped out for a minute and requesting to have her call me back.  Will await Megan's call.

## 2011-03-06 NOTE — Telephone Encounter (Signed)
Still haven't received a call back from John Day.  Called and spoke with her regarding this.  States she does not have a pass code for the system she needs to look in and Jessie, who normally does this, is off this week.  Per Aundra Millet, she will check with someone who is coming in a 5 pm tonight to see if they have a pass code for the system and will call back to let us know.  If not, someone scheduled tomorrow should have this.

## 2011-03-06 NOTE — Telephone Encounter (Signed)
Crystal and/or Triage  We were to make a super D disk , send to [place], and get an ENB done on this pt, I sent the order last week when we saw this pt  I do not see where anything has been done,  Can you f/u on this????   pw

## 2011-03-06 NOTE — Telephone Encounter (Signed)
Aundra Millet called back regarding this.  States this will be done tonight and will be ready tomorrow.  Spoke with Fredna Dow and Libby on how these are delivered - ATC megan back to inform her someone from our office will pick this up tomorrow but she was not available so I left message with Morrie Sheldon informing her someone from our office will be there tomorrow to pick this disc up.  She verbalized understanding of this and will inform Megan.     I also called pt's family member back at [phone] and [email or web address] to inform we are still working on getting this set up.    Will hold message in Triage to remind TD tomorrow this needs to be picked up.

## 2011-03-07 NOTE — Telephone Encounter (Signed)
Order placed for ENB. Pt and Doris notified we will call them once this has been scheduled.

## 2011-03-07 NOTE — Telephone Encounter (Signed)
i put the ref to pulm in last week but I know that is not the current workflow  Yes put the order in please.

## 2011-03-07 NOTE — Telephone Encounter (Signed)
Put in the order  

## 2011-03-07 NOTE — Telephone Encounter (Signed)
The disc is going to be picked up at lunch time by Raliegh Scarlet. I spoke with doris and she is aware we are still working on getting this set up for pt. Will forward to crystal until we get the disc

## 2011-03-07 NOTE — Telephone Encounter (Signed)
Dr. Delford Field, I have given the Super D disc on this pt to [facility] to hold for RB to review.  Did you want RB to review this first or go ahead and proceed with scheduling the ENB?  If you would like to proceed with scheduling ENB now, order will need to be placed.  I do not mind doing this - pls advise.  Thanks!

## 2011-03-08 ENCOUNTER — Ambulatory Visit (HOSPITAL_COMMUNITY): Admission: RE | Admit: 2011-03-08 | Payer: Medicare Other | Source: Ambulatory Visit

## 2011-03-09 ENCOUNTER — Telehealth: Payer: Self-pay | Admitting: Critical Care Medicine

## 2011-03-09 ENCOUNTER — Encounter (HOSPITAL_COMMUNITY)
Admission: RE | Admit: 2011-03-09 | Discharge: 2011-03-09 | Disposition: A | Discharge status: Home / Self Care | Payer: Medicare Other | Source: Ambulatory Visit | Attending: Critical Care Medicine | Admitting: Critical Care Medicine

## 2011-03-09 DIAGNOSIS — J984 Other disorders of lung: Secondary | ICD-10-CM | POA: Insufficient documentation

## 2011-03-09 DIAGNOSIS — R918 Other nonspecific abnormal finding of lung field: Secondary | ICD-10-CM

## 2011-03-09 DIAGNOSIS — R222 Localized swelling, mass and lump, trunk: Secondary | ICD-10-CM | POA: Insufficient documentation

## 2011-03-09 LAB — GLUCOSE, CAPILLARY: Glucose-Capillary: 105 mg/dL — ABNORMAL HIGH (ref 70–99)

## 2011-03-09 MED ORDER — FLUDEOXYGLUCOSE F - 18 (FDG) INJECTION
20.2000 | Freq: Once | INTRAVENOUS | Status: AC | PRN
  Administered 2011-03-09: 20.2 via INTRAVENOUS

## 2011-03-09 NOTE — Telephone Encounter (Signed)
Spoke with Almyra Free regarding when ENB will be scheduled.  Dr. Delton Coombes was off the beginning of the week and on nights last night and tonight.  She has been trying to contact him because she will have to get this scheduled accordingly with his schedule and to please reassure him she is working on this and inform pt she will contact him as soon as she gets this scheduled.     lmomtcb for [employer].

## 2011-03-10 NOTE — Telephone Encounter (Signed)
Libby, before I call pt back to follow up on this, do you have an update?  Please advise. Thanks!

## 2011-03-10 NOTE — Consult Note (Signed)
NAMEMANASSEH, PITTSLEY NO.:  [id number]  MEDICAL RECORD NO.:  [id number]  LOCATION:  [facility]                         FACILITY:  [facility]  PHYSICIAN:  Duke Salvia, MD, FACCDATE OF BIRTH:  04/24/1924  DATE OF CONSULTATION: DATE OF DISCHARGE:                                CONSULTATION   Mr. Vanhise is here at the request of [name] because of spells of syncope and presyncope.  These are being on for the 2 or 3 years, that have become increasingly frequent of late.  They are also more severe.  They are characterized of being of relatively brief duration as to be in the range of 2-5 minutes. They are associated with nausea, diaphoresis, and pallor followed by flushing and surprisingly with little residual orthostatic intolerance. They have occurred while standing, while walking.  They are not clearly related to exertion.  Holter monitoring was undertaken, which was negative.  He was hospitalized for one of these spells a couple of weeks ago.  This was also unrevealing as relates to an arrhythmia.  Yesterday when he was in Dr. [name] office, he did a carotid sinus massage and had a greater than 4-second pause.  Notably, however, the patient denies symptoms with turning of his head, shaving, looking up or over his shoulder.  The patient has a history of coronary artery disease in 1988.  He had an MI and bypass surgery.  He underwent catheterization in 2006 with a LIMA atretic to his LAD, and patent left main stent.  The circumflex was occluded in the vein graft to OM-1 and OM-2 and to the RCA.  Stress test in 2010, which is currently negative.  Ejection fraction at that time was 45%.  His past medical history in addition to the above is notable for prostate cancer with radiation and chemotherapy, hypertension and hyperlipidemia, nephrolithiasis.  He has also had hyperkalemia with ACE inhibitors.  PAST SURGICAL HISTORY:  Notable for appendectomy and cataract  surgery.  MEDICATIONS AS AN OUTPATIENT: 1. Aspirin 81. 2. Amlodipine. 3. Atenolol 50. 4. Lovastatin 40.  ALLERGIES:  He has no known drug allergies.  FAMILY HISTORY:  Notable for heart disease.  SOCIAL HISTORY:  Does not smoke, use alcohol.  He is widowed.  He has a significant other who was with him.  REVIEW OF SYSTEMS:  Broadly negative apart from the above.  PHYSICAL EXAMINATION:  GENERAL:  He is an elderly Caucasian male appearing his stated age of 64. VITAL SIGNS:  His blood pressure is 136/79, his pulse is 57, his respirations are 18 and unlabored. HEENT:  Normal. NECK:  His neck veins are flat.  His carotids are brisk and full. BACK:  Without kyphosis, scoliosis. LUNGS:  Clear. HEART:  Sounds were regular without murmurs or gallops. ABDOMEN:  Soft with active bowel sounds without midline pulsation or hepatomegaly.  Femoral pulses are 2+.  Distal pulses are intact.  There is no clubbing, cyanosis, or edema. NEUROLOGICAL:  Grossly normal. SKIN:  His skin was warm and dry.  Electrocardiogram demonstrated sinus bradycardia at 50 with intervals of 0.15/0.10/0.48.  There are Q-waves in the anterior precordium.  IMPRESSION: 1. Spells, question mechanism. 2.  Coronary artery disease with prior infarction and modest depression     of left ventricular ejection fraction. 3. 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 4. History of prostate cancer. 5. Lung nodules.  From an arrhythmia point-of-view, the concern about these spells would be bradycardia or ventricular tachycardia.  It is not clear to me at what it is.  He is having enough of this, i.e. an event recorder should be very helpful in clarifying an arrhythmia correlation and then directing therapy.  The patient is agreeable with this.  We will use a global cardiac outpatient telemetry to elucidate the arrhythmia correlation.  We will plan to undertake the CAT scan for his lung nodule.  We  also undergo stress testing to look for ischemia and assessment of ejection fraction as the Bezold-Jarisch reflex may be causing this as well.  The patient understands that carotid sinus hypersensitivity can be present and not necessarily the cause of the episodes that he is having. Hence, we will not proceed with pacing immediately today.     Duke Salvia, MD, [facility]     SCK/MEDQ  D:  02/09/2011  T:  02/09/2011  Job:  [id number]  Electronically Signed by Sherryl Manges MD [facility] on 03/10/2011 07:25:10 AM

## 2011-03-10 NOTE — Telephone Encounter (Signed)
ENB scheduled for 03/15/11@10 :40am pt aware and has been given instructions

## 2011-03-13 ENCOUNTER — Encounter (HOSPITAL_COMMUNITY)
Admission: RE | Admit: 2011-03-13 | Discharge: 2011-03-13 | Disposition: A | Discharge status: Home / Self Care | Payer: Medicare Other | Source: Ambulatory Visit | Attending: Emergency Medicine | Admitting: Emergency Medicine

## 2011-03-13 ENCOUNTER — Encounter (HOSPITAL_BASED_OUTPATIENT_CLINIC_OR_DEPARTMENT_OTHER): Payer: Medicare Other | Admitting: Internal Medicine

## 2011-03-13 ENCOUNTER — Other Ambulatory Visit: Payer: Self-pay | Admitting: Internal Medicine

## 2011-03-13 DIAGNOSIS — R918 Other nonspecific abnormal finding of lung field: Secondary | ICD-10-CM

## 2011-03-13 LAB — CBC WITH DIFFERENTIAL/PLATELET
BASO%: 0.3 % (ref 0.0–2.0)
Eosinophils Absolute: 0.3 10*3/uL (ref 0.0–0.5)
MCHC: 33.9 g/dL (ref 32.0–36.0)
MONO#: 0.6 10*3/uL (ref 0.1–0.9)
NEUT#: 4.5 10*3/uL (ref 1.5–6.5)
RBC: 4.78 10*6/uL (ref 4.20–5.82)
RDW: 13.3 % (ref 11.0–14.6)
WBC: 6.9 10*3/uL (ref 4.0–10.3)
lymph#: 1.5 10*3/uL (ref 0.9–3.3)

## 2011-03-13 LAB — COMPREHENSIVE METABOLIC PANEL
ALT: 13 U/L (ref 0–53)
Albumin: 4.5 g/dL (ref 3.5–5.2)
CO2: 29 mEq/L (ref 19–32)
Glucose, Bld: 105 mg/dL — ABNORMAL HIGH (ref 70–99)
Potassium: 5.1 mEq/L (ref 3.5–5.3)
Sodium: 140 mEq/L (ref 135–145)
Total Bilirubin: 1 mg/dL (ref 0.3–1.2)
Total Protein: 7.1 g/dL (ref 6.0–8.3)

## 2011-03-14 ENCOUNTER — Inpatient Hospital Stay (HOSPITAL_COMMUNITY)
Admission: RE | Admit: 2011-03-14 | Discharge: 2011-03-17 | DRG: 167 | Disposition: A | Discharge status: Home / Self Care | Payer: Medicare Other | Source: Ambulatory Visit | Attending: Pulmonary Disease | Admitting: Pulmonary Disease

## 2011-03-14 DIAGNOSIS — R55 Syncope and collapse: Secondary | ICD-10-CM | POA: Diagnosis present

## 2011-03-14 DIAGNOSIS — J95811 Postprocedural pneumothorax: Secondary | ICD-10-CM | POA: Diagnosis not present

## 2011-03-14 DIAGNOSIS — I2510 Atherosclerotic heart disease of native coronary artery without angina pectoris: Secondary | ICD-10-CM | POA: Diagnosis present

## 2011-03-14 DIAGNOSIS — I519 Heart disease, unspecified: Secondary | ICD-10-CM | POA: Diagnosis present

## 2011-03-14 DIAGNOSIS — Z951 Presence of aortocoronary bypass graft: Secondary | ICD-10-CM

## 2011-03-14 DIAGNOSIS — R911 Solitary pulmonary nodule: Principal | ICD-10-CM | POA: Diagnosis present

## 2011-03-14 DIAGNOSIS — Y849 Medical procedure, unspecified as the cause of abnormal reaction of the patient, or of later complication, without mention of misadventure at the time of the procedure: Secondary | ICD-10-CM | POA: Diagnosis present

## 2011-03-14 DIAGNOSIS — T82897A Other specified complication of cardiac prosthetic devices, implants and grafts, initial encounter: Secondary | ICD-10-CM | POA: Diagnosis present

## 2011-03-14 LAB — BASIC METABOLIC PANEL
CO2: 27 mEq/L (ref 19–32)
Calcium: 10.5 mg/dL (ref 8.4–10.5)
Chloride: 103 mEq/L (ref 96–112)
Creatinine, Ser: 1.26 mg/dL (ref 0.50–1.35)
GFR calc Af Amer: 58 mL/min — ABNORMAL LOW (ref >90)
Sodium: 139 mEq/L (ref 135–145)

## 2011-03-14 LAB — CBC
MCH: 32.1 pg (ref 26.0–34.0)
MCV: 94.6 fL (ref 78.0–100.0)
Platelets: 183 10*3/uL (ref 150–400)
RBC: 4.67 MIL/uL (ref 4.22–5.81)
RDW: 12.9 % (ref 11.5–15.5)
WBC: 7.7 10*3/uL (ref 4.0–10.5)

## 2011-03-14 LAB — PROTIME-INR
INR: 0.93 (ref 0.00–1.49)
Prothrombin Time: 12.7 seconds (ref 11.6–15.2)

## 2011-03-15 ENCOUNTER — Other Ambulatory Visit: Payer: Self-pay | Admitting: Emergency Medicine

## 2011-03-15 ENCOUNTER — Ambulatory Visit (HOSPITAL_COMMUNITY): Admission: RE | Admit: 2011-03-15 | Payer: Medicare Other | Source: Ambulatory Visit | Admitting: Emergency Medicine

## 2011-03-15 ENCOUNTER — Ambulatory Visit (HOSPITAL_COMMUNITY): Payer: Medicare Other

## 2011-03-15 DIAGNOSIS — I2510 Atherosclerotic heart disease of native coronary artery without angina pectoris: Secondary | ICD-10-CM

## 2011-03-15 DIAGNOSIS — R222 Localized swelling, mass and lump, trunk: Secondary | ICD-10-CM

## 2011-03-15 LAB — BASIC METABOLIC PANEL
Calcium: 9.1 mg/dL (ref 8.4–10.5)
GFR calc Af Amer: 59 mL/min — ABNORMAL LOW (ref >90)
GFR calc non Af Amer: 51 mL/min — ABNORMAL LOW (ref >90)
Potassium: 4.1 mEq/L (ref 3.5–5.1)
Sodium: 139 mEq/L (ref 135–145)

## 2011-03-15 LAB — CBC
Hemoglobin: 13.7 g/dL (ref 13.0–17.0)
MCH: 31.5 pg (ref 26.0–34.0)
MCHC: 32.5 g/dL (ref 30.0–36.0)
Platelets: 181 10*3/uL (ref 150–400)

## 2011-03-15 LAB — GLUCOSE, CAPILLARY: Glucose-Capillary: 110 mg/dL — ABNORMAL HIGH (ref 70–99)

## 2011-03-16 ENCOUNTER — Ambulatory Visit (HOSPITAL_COMMUNITY): Payer: Medicare Other

## 2011-03-16 DIAGNOSIS — R222 Localized swelling, mass and lump, trunk: Secondary | ICD-10-CM

## 2011-03-16 DIAGNOSIS — J95811 Postprocedural pneumothorax: Secondary | ICD-10-CM

## 2011-03-16 DIAGNOSIS — I2510 Atherosclerotic heart disease of native coronary artery without angina pectoris: Secondary | ICD-10-CM

## 2011-03-16 LAB — BASIC METABOLIC PANEL
CO2: 26 mEq/L (ref 19–32)
Chloride: 103 mEq/L (ref 96–112)
Creatinine, Ser: 1.23 mg/dL (ref 0.50–1.35)
GFR calc Af Amer: 60 mL/min — ABNORMAL LOW (ref >90)
Potassium: 4.5 mEq/L (ref 3.5–5.1)
Sodium: 139 mEq/L (ref 135–145)

## 2011-03-16 LAB — CBC
MCV: 96.8 fL (ref 78.0–100.0)
Platelets: 186 10*3/uL (ref 150–400)
RBC: 4.02 MIL/uL — ABNORMAL LOW (ref 4.22–5.81)
RDW: 12.8 % (ref 11.5–15.5)
WBC: 13.3 10*3/uL — ABNORMAL HIGH (ref 4.0–10.5)

## 2011-03-17 ENCOUNTER — Other Ambulatory Visit: Payer: Self-pay | Admitting: Adult Health

## 2011-03-17 ENCOUNTER — Ambulatory Visit (HOSPITAL_COMMUNITY): Payer: Medicare Other

## 2011-03-17 LAB — CULTURE, RESPIRATORY W GRAM STAIN
Culture: NO GROWTH
Gram Stain: NONE SEEN

## 2011-03-19 NOTE — Cardiovascular Report (Signed)
NAMEKRISTINE, [name] NO.:  [id number]  MEDICAL RECORD NO.:  [id number]  LOCATION:  2507                         FACILITY:  [facility]  PHYSICIAN:  Pamella Pert, MD DATE OF BIRTH:  [birth year]  DATE OF PROCEDURE:  03/14/2011 DATE OF DISCHARGE:                           CARDIAC CATHETERIZATION   ATTENDING PHYSICIAN:  Aida Puffer, MD.  PROCEDURES PERFORMED: 1. Left ventriculography. 2. Selective right and left coronary arteriography. 3. Saphenous vein graft angiography. 4. Right femoral angiography and closure of the right femoral arterial     access with Angio-Seal.  INDICATION:  Mr. [name] is an 77 year old gentleman with history of known coronary artery disease.  He has undergone distal left main and proximal LAD stenting in 2004.  By cardiac catheterization in 2006, it was found to be patent.  He has had a stress test in 2010, that had revealed inferior wall scar without any other ischemia with ejection fraction of 45%-50%.  Recently because of episodes of syncope, he had undergone a stress test which had revealed new anterolateral ischemia. He is now brought to the cardiac catheterization lab to evaluate his coronary anatomy.  The patient is also scheduled for bronchoscopy with biopsy tomorrow under general anesthesia.  I felt that this was a high-risk procedure if there was a high-grade stenosis in the proximal LAD stent.  Hence, I felt that we should probably proceed with cardiac catheterization prior to him undergoing bronchoscopy with biopsy.  HEMODYNAMIC DATA:  The left ventricular pressure was 169/4 with end- diastolic of 22 mmHg.  Aortic pressure was 127/42 with a mean of 71 mmHg.  There was no pressure gradient across the aortic valve.  ANGIOGRAPHIC DATA: 1. Left ventricle.  Left ventricular systolic function was moderate-to-     severely depressed with ejection fraction of 35%-40% with inferior     and inferoseptal akinesis and mild  global hypokinesis.  There is no     significant mitral regurgitation. 2. Right coronary artery.  Right coronary artery is occluded in the     proximal segment.  Distal right coronary artery is supplied by     saphenous vein graft to the PDA. 3. SVG to PDA.  SVG to PDA is widely patent.  The right coronary     artery gives extensive collaterals to the occluded LAD. 4. Left main coronary artery.  Left main coronary artery is heavily     calcified.  It is widely patent.  The proximal segment of the stent     that was previously placed is also widely patent. 5. Circumflex coronary artery.  Circumflex coronary artery has an     ostial 70%-80% calcific stenosis.  This is not significantly     changed from cardiac catheterizations in 2007.  It gives origin to     AV groove branch and a small obtuse marginal 1.  The circumflex is     occluded after the origin of the obtuse marginal.  Obtuse marginal     2 and 3 are supplied by saphenous vein graft. 6. SVG to OM-2 and OM-3.  SVG to OM-2 and OM-3 are widely patent. 7. LAD.  LAD is occluded in  the proximal segment within the stent.     The mid-to-distal LAD are supplied via collaterals from the right     coronary artery.  There is diffuse disease noted in the native LAD,     however, it does have collaterals from the right coronary artery.     It is a short-to-moderate segment occlusion.  RECOMMENDATION:  Based on the coronary anatomy, he should probably undergo bronchoscopy with biopsy.  Overall, he would be considered at least a moderate risk for preprocedural complications, especially cardiac complications.  However, this is the best opportunity for the patient to have a tissue diagnosis for probable bronchogenic carcinoma. The LAD appears to be amenable for percutaneous revascularization of chronic total occlusion, however, he is well protected via collaterals from the right coronary artery.  Hence, I suspect that he should do  well preprocedure wise from cardiac standpoint.  I have discussed the findings with Dr. Shan Levans and also with Dr. Levy Pupa.  They are in agreement.  I am going to keep the patient in-house as the procedure is scheduled for tomorrow given the fact that he is 77 years of age and is widowed and lives by himself.  TECHNIQUE OF PROCEDURE:  Under sterile precautions using a 6-French right femoral arterial access, 6-French multipurpose B2 catheter was advanced into the left ventricle.  Left ventriculography was performed both in LAO and RAO projections.  Catheter pulled into the ascending aorta.  Right coronary selectively engaged and angiography was performed.  Then, the left main coronary selectively engaged and angiography was performed.  Then, the catheter was utilized to engage the SVG to RCA and SVG to OM-2 and OM-3, and then pulled out of the body.  Right femoral arteriography was performed through the arterial access sheath.  Initially, I attempted to close the arterial access with Perclose, but because of inability to capture the device, capture of the arterial wall due to calcification, I abandoned the procedure and the device was pulled out after reintroducing the J-wire back.  I closed the access with 6-French Angio-Seal with excellent hemostasis.  The patient tolerated the procedure well.  There was no immediate complication.     Pamella Pert, MD     JRG/MEDQ  D:  03/14/2011  T:  03/14/2011  Job:  [id number]  cc:   Aida Puffer, MD  Electronically Signed by Yates Decamp MD on 03/19/2011 01:43:08 PM

## 2011-03-20 ENCOUNTER — Other Ambulatory Visit: Payer: Self-pay | Admitting: Cardiology

## 2011-03-20 NOTE — Discharge Summary (Signed)
NAMEMADDOCK, FINIGAN                ACCOUNT NO.:  [id number]  MEDICAL RECORD NO.:  [id number]  LOCATION:  SDS                          FACILITY:  [facility]  PHYSICIAN:  Leslye Peer, MD    DATE OF BIRTH:  Apr 16, 1924  DATE OF ADMISSION:  03/15/2011 DATE OF DISCHARGE:                              DISCHARGE SUMMARY   DISCHARGE DIAGNOSES: 1. Coronary artery disease, status post coronary artery bypass     grafting in 1988, with left heart cath on March 14, 2011. 2. Right lower lobe mass, status post esthesioneuroblastoma. 3. Right pneumothorax, status post esthesioneuroblastoma.  CONSULTING MD:  Pamella Pert, MD  PROCEDURES DURING HOSPITALIZATION: 1. March 14, 2011, left heart catheterization. 2. March 15, 2011, video fiberoptic bronchoscopy with ENB and     biopsies.  LABORATORY DATA:  March 16, 2011, BMP demonstrates sodium 139, potassium 4.5, chloride 103, CO2 26, glucose 136, BUN 22, creatinine 1.23, calcium 9.6; March 16, 2011, CBC demonstrates WBC 13.3, hemoglobin 12.6, hematocrit 38.9, MCV 96.8 and platelet count 186.  MICRO DATA: 1. February 09, 2011, surgical screen, PCR is negative for MRSA.     March 15, 2011, bronchial washings for yeast is negative on     preliminary results.  Final results pending. 2. March 15, 2011, bronchial washings for AFB is negative on     preliminary results.  Final results pending. 3. March 15, 2011, respiratory culture from bronchial washing is     negative.  PATHOLOGY: 1. March 15, 2011, transbronchial needle aspiration of the right     lower lobe lung mass demonstrates benign reactive changes.  No     malignant cells identified. 2. Surgical pathology of the right lower lobe lung biopsy demonstrates     benign lung parenchyma with granulomatous inflammation.  No     evidence of malignancy.  RADIOLOGIC DATA: 1. March 09, 2011, PET scan demonstrates lobulated right lower lobe     lung mass with neoplastic  range FDG uptake.  A 10-mm irregular     right upper lobe pulmonary lesions weakly positive.  No mediastinal     or hilar lymphadenopathy and no other findings for metastatic     disease. 2. March 15, 2011, chest x-ray post FOB demonstrates 10-15% right     apical pneumothorax. 3. November 31, 2012, follow up chest x-ray demonstrates no     significant change in right pneumothorax. March 16, 2011, chest x-     ray demonstrates mild reduction in size of right pneumothorax     approximately 15%. 4. March 17, 2011, chest x-ray demonstrates stable small right     pneumothorax.  HISTORY OF PRESENT ILLNESS:  [name] is a 77 year old white male with a known history of coronary artery disease, hypertension, hyperlipidemia, history of prostate cancer with radiation in 2000 and bladder cancer.  [name] was undergoing evaluation of right lower lobe lung nodule which was noted on CT of the chest in September 2012, measuring about 4-cm in diameter with irregular borders.  He further underwent PET scan on March 09, 2011, which did have high uptake concerning for malignancy.  Prior to admission, he had been seen and  evaluated by [name] for preop cardiac clearance for scheduled transbronchial biopsy.  In evaluation of coronary risk, [name] underwent a left heart cath by [name] which demonstrated moderate to severely depressed left ventricular systolic function with an EF 35-40%. Based on his coronary anatomy, he was cleared for bronchoscopy with biopsy and considered moderate risk for preprocedural complications.  He underwent video fiberoptic bronchoscopy with ENB on March 15, 2011. Pathology returned at this time is negative for malignancy.  However, was noted to have granulomatous inflammation findings as above.  This does raise concerns for potential atypical infection such as tuberculosis.  However, has also possibility that adequate tissue sampling was unable to be  achieved.  Post procedure, [name] did have a small pneumothorax which was serially monitored with chest x-rays and did not change and he was not in any acute distress.  He was placed on high-flow O2 in the hopes that pneumothorax has spontaneously resolved. On day of discharge, chest x-ray does demonstrate a small right apical pneumothorax, however, the patient is in no distress and at this time he has been cleared for discharge home.  However, it has been discussed with [name] that if he is to note any change in shortness of breath or chest pain, he is to immediately call EMS and return to [facility].  HOSPITAL COURSE BY DISCHARGE DIAGNOSES: 1. Coronary artery disease, status post left heart cath on March 14, 2011.  As per HPI, [name] does have a known history of coronary     artery disease and hypertension.  He is status post CABG in 1988.     For preop evaluation of coronary risk, [name] underwent a left     heart cath per [name] which demonstrated moderate to severe LV     systolic dysfunction.  It was considered a moderate risk for pre     and intraoperative complications from a cardiac standpoint.  [name] did undergo a fiberoptic bronchoscopy on March 15, 2011,     and tolerated it well from a cardiac standpoint. 2. Right lower lobe mass, status post ENB.  As per HPI, [name] did     have a known history of right lower lobe lung mass with a positive     PET scan concerning for malignancy on March 09, 2011.  He is     being evaluated as an outpatient per Dr. Delford Field and Dr. Gwenyth Bouillon.     He was scheduled for transbronchial biopsy and washings per Dr.     Delton Coombes and preliminary pathology demonstrates no evidence of     malignancy, but granulomatosis inflammation was noted.  This does     raise concern for a potential atypical infection.  PPD was placed     on March 16, 2011, and on day of discharge March 17, 2011, in     review by this nurse  practitioner, preliminary findings are     negative.  [name] will be due for a reading of his skin test on     Sunday and given his advanced age 77 and limited access for someone to     read his TB skin test, [name] took a picture of his arm on day     of discharge and has been instructed to take another picture of his     arm on Sunday for review by the nurse practitioner in followup.     Again at  this time, the preliminary PPD is negative.  He has     minimal amount of erythema with no raised area. 3. Right pneumothorax, status post ENB.  Post procedure, [name] was     noted to have a small pneumothorax.  He was placed on 100% FiO2     with hopeful resolution of pneumothorax spontaneously.     Pneumothorax did not increase in size, however, did get slightly     smaller and at time of discharge, [name] is in no acute distress     and does not feel short of breath or seem to be aware of slight     pneumothorax.  At this time, he is cleared for discharge home with     follow up chest x-ray in the office on March 21, 2011.  He has     been instructed and understands with verbal confirmation that if he     experiences any new shortness of breath or chest pain that he     should return immediately to the emergency department. DISCHARGE INSTRUCTIONS: 1. Activity as tolerated. 2. Diet heart healthy. 3. Special instructions.  He is instructed to take a picture of his     right forearm on Sunday to compare what the previous picture taken     on day of discharge, for TB skin test.  He also has been instructed     if there is any sudden increase in shortness of breath or chest     pain to return to the emergency department.  FOLLOWUP:  He is scheduled to follow up with Rubye Oaks, nurse practitioner, in [place] at [facility] on March 21, 2011, at 9:30 a.m. for chest x-ray and appointment time at 9:45.  DISCHARGE MEDICATIONS: 1. Amlodipine 2.5 mg by mouth daily 2.  Aspirin 81 mg by mouth daily. 3. Atenolol 50 mg by mouth daily. 4. Vitamin over-the-counter medication by mouth daily. 5. Lovastatin 40 mg by mouth daily. 6. Vitamin D2 50,000 units 1 capsule by mouth every 3 months.  DISPOSITION AT TIME OF DISCHARGE:  [name] has met maximum benefit of inpatient therapy and is currently medically stable and cleared for discharge.  Pending follow up with Rubye Oaks, nurse practitioner, on March 21, 2011, as above.  Time spent on disposition greater than 35 minutes.     Canary Brim, NP   ______________________________ Leslye Peer, MD    BO/MEDQ  D:  03/17/2011  T:  03/18/2011  Job:  [id number]  cc:   Rubye Oaks, NP Pamella Pert, MD  Electronically Signed by Canary Brim  on 03/20/2011 01:49:47 PM Electronically Signed by Levy Pupa MD on 03/21/2011 12:08:43 PM

## 2011-03-21 ENCOUNTER — Ambulatory Visit (INDEPENDENT_AMBULATORY_CARE_PROVIDER_SITE_OTHER): Payer: Medicare Other | Admitting: Adult Health

## 2011-03-21 ENCOUNTER — Encounter: Payer: Self-pay | Admitting: Adult Health

## 2011-03-21 ENCOUNTER — Ambulatory Visit (INDEPENDENT_AMBULATORY_CARE_PROVIDER_SITE_OTHER)
Admission: RE | Admit: 2011-03-21 | Discharge: 2011-03-21 | Disposition: A | Discharge status: Home / Self Care | Payer: Medicare Other | Source: Ambulatory Visit | Attending: Adult Health | Admitting: Adult Health

## 2011-03-21 VITALS — BP 112/68 | HR 60 | Temp 96.8°F | Ht 67.5 in | Wt 146.2 lb

## 2011-03-21 DIAGNOSIS — R222 Localized swelling, mass and lump, trunk: Secondary | ICD-10-CM

## 2011-03-21 DIAGNOSIS — R918 Other nonspecific abnormal finding of lung field: Secondary | ICD-10-CM

## 2011-03-21 DIAGNOSIS — J9383 Other pneumothorax: Secondary | ICD-10-CM

## 2011-03-21 DIAGNOSIS — J95811 Postprocedural pneumothorax: Secondary | ICD-10-CM

## 2011-03-21 NOTE — Progress Notes (Signed)
Subjective:    Patient ID: [name], male    DOB: 06/17/1923, 77 y.o.   MRN: [id number]  HPI 77 yo male referred for lung mass 03/02/11 for initial pulmonary consult   03/02/11 PUlmonary Consult  Episodes for three years, getting diaphoretic and clammy. Then 6 weeks ago spell of syncope, ? How long out? Awoke with soaking sweats. Admitted and w/u at [facility] "neg"  PCP saw the pt and referred to [facility]  Dr Clarene Duke obtained CT Chest ,  Nodule seen in the chest.  Then set up for PPM placement,  Pt went to hospital and the Dr Graciela Husbands , saw the pt and told pt he did not need the PPM,  3weeks ago.  So then pt put on a 30day event monitor.   Now no real cough, notes dyspnea on exertion on >439ft.  Notes some chest tightness. No recent spells of syncope . Notes some edema.  Notes occ indigestion.  No current smoker, since CABG in 89  03/21/2011 Hospital follow up  Patient was admitted 03/15/2011 for a biopsy of a lung nodule. CT scan of the chest showed a 4 cm nodule in the right lower lobe with irregular borders. A subsequent PET scan showed high uptake concerning for malignancy. The patient underwent a ENB by Dr. Delton Coombes on October 31. Pathology showed no malignant cells, showed granulomatous inflammation. Patient had a PPD placed that appears to be negative. Unfortunately, patient developed a small right apical pneumothorax. Serial chest x-ray showed decrease in size. Today he x-ray shows resolution of pneumothorax.  Patient also underwent a cardiac evaluation. Due to a recent syncopal episode. Patient was seen by [name] patient underwent a cardiac catheterization that demonstrated moderate to severe left ventricle systolic dysfunction. He was considered a moderate risk for pre-and intraoperative complications from cardiac standpoint. Patient has had a previous CABG in 1988.  Dr. Delford Field came in to discuss pathology results. Patient and wife were notified of his benign results however, felt that this was  not a conclusive to totally rule out cancer. Patient was offered an appointment with  The thoracic surgeon for possible consideration of nodule removal with resection. However, patient declined this referral. At this time. We will set patient up for a close followup with CT scan in 6 weeks to evaluate the nodule.  Patient denies any chest pain, hemoptysis, syncopal episodes, edema, or shortness of breath.    Past Medical History  Diagnosis Date  . Coronary artery disease     Remote CABG in 1988 with last cath in 2006, last stress test in 2010  . Hyperkalemia     with ACE inhibitor  . Hypertension     controlled with Amlodipine  . Hyperlipidemia   . History of nephrolithiasis   . Myocardial infarction     July 1988/ Anterior MI with CABG in 1989  . Prostate cancer     treated with radiation in 2000  . History of bladder cancer   . Abnormal nuclear cardiac imaging test 2010    with apical akinesia with prior infarct. No ischemia  . History of heart attack    History   Social History  . Marital Status: Widowed    Spouse Name: N/A    Number of Children: N/A  . Years of Education: N/A   Occupational History  . RETIRED     Plumbing work   Social History Main Topics  . Smoking status: Former Smoker -- 0.8 packs/day for 55 years  Types: Cigarettes    Quit date: 05/16/1987  . Smokeless tobacco: Never Used  . Alcohol Use: No  . Drug Use: Not on file  . Sexually Active: Not on file   Other Topics Concern  . Not on file   Social History Narrative  . No narrative on file    Family History  Problem Relation Age of Onset  . Heart attack Mother   . Heart disease Father   . Coronary artery disease Sister   . Asthma Brother   . Asthma Sister   . Emphysema Brother         Review of Systems Constitutional:   No  weight loss, night sweats,  Fevers, chills, ++ fatigue, or  lassitude.  HEENT:   No headaches,  Difficulty swallowing,  Tooth/dental problems, or  Sore  throat,                No sneezing, itching, ear ache, nasal congestion, post nasal drip,   CV:  No chest pain,  Orthopnea, PND, swelling in lower extremities, anasarca, dizziness, palpitations, syncope.   GI  No heartburn, indigestion, abdominal pain, nausea, vomiting, diarrhea, change in bowel habits, loss of appetite, bloody stools.   Resp: No shortness of breath with exertion or at rest.  No excess mucus, no productive cough,  No non-productive cough,  No coughing up of blood.  No change in color of mucus.  No wheezing.  No chest wall deformity  Skin: no rash or lesions.  GU: no dysuria, change in color of urine, no urgency or frequency.  No flank pain, no hematuria   MS:  No joint pain or swelling.  No decreased range of motion.  No back pain.  Psych:  No change in mood or affect. No depression or anxiety.  No memory loss.         Objective:   Physical Exam GEN: A/Ox3; pleasant , NAD, elderly /frail   HEENT:  /AT,  EACs-clear, TMs-wnl, NOSE-clear, THROAT-clear, no lesions, no postnasal drip or exudate noted.   NECK:  Supple w/ fair ROM; no JVD; normal carotid impulses w/o bruits; no thyromegaly or nodules palpated; no lymphadenopathy.  RESP  Clear  P & A; w/o, wheezes/ rales/ or rhonchi.no accessory muscle use, no dullness to percussion  CARD:  RRR, 1-2 SM  , no peripheral edema, pulses intact, no cyanosis or clubbing.  GI:   Soft & nt; nml bowel sounds; no organomegaly or masses detected.  Musco: Warm bil, no deformities or joint swelling noted.   Neuro: alert, no focal deficits noted.    Skin: Warm, no lesions or rashes   CXR:  Interval resolution of small right pneumothorax.   Persistent right lower lobe opacity.       Assessment & Plan:  Lung mass Details of ENB bx results/path finding were discussed in detail with pt and wife   CT chest >>Mass within the superior segment of the right lower lobe, . Stable small pulmonary nodule in the right upper lobe  compared with 2007. PET >>high uptake in right lower lobe nodule , No mediastinal or hilar lymphadenopathy and no findings for  metastatic disease, 10 mm irregular right upper lobe pulmonary lesion anteriorly  demonstrates weakly positive FDG uptake  ENB bx 03/15/11 >path showed no malignant cells, granulamatous cells  pt/wife  prefers to watch area closely with serial CT follow up , hold on thoracic surgeon referral for now   Plan:  Set up for CT chest in  6 weeks with follow up with Dr. Delford Field    Pneumothorax after biopsy Resolved on xray today

## 2011-03-21 NOTE — Op Note (Signed)
NAMEMARQUEL, POTTENGER NO.:  [id number]  MEDICAL RECORD NO.:  [id number]  LOCATION:  2507                         FACILITY:  [facility]  PHYSICIAN:  Leslye Peer, MD    DATE OF BIRTH:  Oct 09, 1923  DATE OF PROCEDURE:  03/17/2011 DATE OF DISCHARGE:  03/17/2011                              OPERATIVE REPORT   PROCEDURE:  Video fiberoptic bronchoscopy with electromagnetic navigation and biopsies.  OPERATOR:  Leslye Peer, MD  INDICATION:  Right lower lobe mass.  Medications given.  ANESTHESIA:  General anesthesia, and indeed with endotracheal intubation.  CONSENT:  Informed consent was obtained from the patient and a signed copy was on his hospital chart.  PROCEDURE DETAILS:  [name] is an 77 year old gentleman followed by Dr. Shan Levans at [facility] who was noted to have a right lower lobe mass in the superior segment, which was noted on CT scan and then confirmed to be hypermetabolic on PET scan performed March 09, 2011.  He was admitted to the hospital on March 14, 2011, in order to have Cardiology evaluation and risk stratification prior to planned biopsy.  This was performed by Dr. Jeanella Cara.  It was established on left heart catheterization that he had an occluded LAD but with good collateral flow that made him appropriate for our procedure.  His notes, test results and physical exam were all reviewed.  He was deemed appropriate for general anesthesia and for a bronchoscopy.  He was taken for the procedure on March 17, 2011.  After informed consent was obtained, he was taken to the operating room and general anesthesia was initiated and he was endotracheally intubated.  Using the high- resolution CT scan data obtained prior to the date of the procedure, we were able to generate a virtual tracheobronchial tree and a distinct navigation pathway to the patient's right lower lobe lesion.  The bronchoscope was introduced through  the patient's endotracheal tube and then using the locator guide and the navigation pathway, we were able to extend both the locator guide and the working channel 2 within 1.5 cm of the patient's right lower lobe mass.  The working channel was left in place and under fluoroscopic guidance, needle brushings, needle biopsies, forceps biopsies, and washings were obtained.  These were sent for pathology, cytology, and microbiology.  There was a small amount of bleeding with good hemostasis at the end of the case.  Fluoroscopic evaluation post biopsies showed a possible right apical pneumothorax. An upright chest x-ray confirmed the presence of 10-15% pneumothorax at the end of the case.  The patient was extubated successfully and was in good condition without any evidence of chest pain or respiratory distress.  For this reason, he was placed on 100% oxygen and kept in the hospital for observation for another 48 hours to ensure that pneumothorax was not worsening.  His pneumothorax improved slightly but did not resolve.  He was discharged to home 48 hours after the procedure.  SAMPLES: 1. Needle brushings from the right lower lobe. 2. Wang needle biopsies from the right lower lobe. 3. Forceps biopsies from the right lower lobe. 4. Washings from the right lower  lobe.  PLAN:  We will follow up closely with Mr. Parlato to ensure that his pneumothorax is resolving and to review his biopsy results.  He will be seen 2-3 days post discharge to home.     Leslye Peer, MD     RSB/MEDQ  D:  03/21/2011  T:  03/21/2011  Job:  [id number]

## 2011-03-21 NOTE — Assessment & Plan Note (Signed)
Resolved on x-ray today 

## 2011-03-21 NOTE — Patient Instructions (Signed)
Dr Delford Field is your main lung doctor (pulmonologist)  Dr. Delton Coombes is the lung doctor that did the lung biopsy Dr. Gwenyth Bouillon is the cancer specialist (oncologist) [name] is the heart specialist (cardiologist) Dr. Clarene Duke is your family doctor (Internist)  Biopsy did not show any cancer cells, it did show granulomas (inflammation cells)-We can not totally prove this is not cancer  You have decided to wait and watch this area very closely and repeat the CT scan in 6 weeks  We will hold on referral to Thoracic surgeon for now.  follow up Dr. Delford Field  In 6 weeks and As needed

## 2011-03-21 NOTE — Assessment & Plan Note (Signed)
Details of ENB bx results/path finding were discussed in detail with pt and wife   CT chest >>Mass within the superior segment of the right lower lobe, . Stable small pulmonary nodule in the right upper lobe compared with 2007. PET >>high uptake in right lower lobe nodule , No mediastinal or hilar lymphadenopathy and no findings for  metastatic disease, 10 mm irregular right upper lobe pulmonary lesion anteriorly  demonstrates weakly positive FDG uptake  ENB bx 03/15/11 >path showed no malignant cells, granulamatous cells  pt/wife  prefers to watch area closely with serial CT follow up , hold on thoracic surgeon referral for now   Plan:  Set up for CT chest in 6 weeks with follow up with Dr. Delford Field

## 2011-03-29 ENCOUNTER — Ambulatory Visit: Payer: Medicare Other | Admitting: Internal Medicine

## 2011-03-29 ENCOUNTER — Other Ambulatory Visit: Payer: Medicare Other

## 2011-04-12 LAB — FUNGUS CULTURE W SMEAR: Fungal Smear: NONE SEEN

## 2011-04-21 ENCOUNTER — Other Ambulatory Visit: Payer: Self-pay | Admitting: Endocrinology

## 2011-04-21 DIAGNOSIS — E042 Nontoxic multinodular goiter: Secondary | ICD-10-CM

## 2011-04-27 LAB — AFB CULTURE WITH SMEAR (NOT AT ARMC)

## 2011-05-02 ENCOUNTER — Ambulatory Visit (INDEPENDENT_AMBULATORY_CARE_PROVIDER_SITE_OTHER)
Admission: RE | Admit: 2011-05-02 | Discharge: 2011-05-02 | Disposition: A | Discharge status: Home / Self Care | Payer: Medicare Other | Source: Ambulatory Visit | Attending: Adult Health | Admitting: Adult Health

## 2011-05-02 ENCOUNTER — Encounter: Payer: Self-pay | Admitting: Adult Health

## 2011-05-02 DIAGNOSIS — R918 Other nonspecific abnormal finding of lung field: Secondary | ICD-10-CM

## 2011-05-02 DIAGNOSIS — R222 Localized swelling, mass and lump, trunk: Secondary | ICD-10-CM

## 2011-05-04 ENCOUNTER — Ambulatory Visit: Payer: Medicare Other | Admitting: Critical Care Medicine

## 2011-05-18 ENCOUNTER — Ambulatory Visit: Payer: Medicare Other | Admitting: Critical Care Medicine

## 2011-05-25 ENCOUNTER — Ambulatory Visit (INDEPENDENT_AMBULATORY_CARE_PROVIDER_SITE_OTHER): Payer: Medicare Other | Admitting: Critical Care Medicine

## 2011-05-25 ENCOUNTER — Encounter: Payer: Self-pay | Admitting: Critical Care Medicine

## 2011-05-25 VITALS — BP 122/72 | HR 76 | Temp 98.1°F | Ht 67.0 in | Wt 141.0 lb

## 2011-05-25 DIAGNOSIS — J984 Other disorders of lung: Secondary | ICD-10-CM

## 2011-05-25 DIAGNOSIS — R222 Localized swelling, mass and lump, trunk: Secondary | ICD-10-CM

## 2011-05-25 DIAGNOSIS — R911 Solitary pulmonary nodule: Secondary | ICD-10-CM

## 2011-05-25 DIAGNOSIS — R918 Other nonspecific abnormal finding of lung field: Secondary | ICD-10-CM

## 2011-05-25 NOTE — Progress Notes (Signed)
Subjective:    Patient ID: [name], male    DOB: 1923-09-10, 78 y.o.   MRN: [id number]  HPI  78 yo male referred for lung mass 03/02/11 for initial pulmonary consult   03/02/11 PUlmonary Consult  Episodes for three years, getting diaphoretic and clammy. Then 6 weeks ago spell of syncope, ? How long out? Awoke with soaking sweats. Admitted and w/u at [facility] "neg"  PCP saw the pt and referred to [facility].  Dr Clarene Duke obtained CT Chest ,  Nodule seen in the chest.  Then set up for PPM placement,  Pt went to hospital and the Dr Graciela Husbands , saw the pt and told pt he did not need the PPM,  3weeks ago.  So then pt put on a 30day event monitor.   Now no real cough, notes dyspnea on exertion on >414ft.  Notes some chest tightness. No recent spells of syncope . Notes some edema.  Notes occ indigestion.  No current smoker, since CABG in 89  10/12 Hospital follow up  Patient was admitted 03/15/2011 for a biopsy of a lung nodule. CT scan of the chest showed a 4 cm nodule in the right lower lobe with irregular borders. A subsequent PET scan showed high uptake concerning for malignancy. The patient underwent a ENB by Dr. Delton Coombes on October 31. Pathology showed no malignant cells, showed granulomatous inflammation. Patient had a PPD placed that appears to be negative. Unfortunately, patient developed a small right apical pneumothorax. Serial chest x-ray showed decrease in size. Today he x-ray shows resolution of pneumothorax.  Patient also underwent a cardiac evaluation. Due to a recent syncopal episode. Patient was seen by [name] patient underwent a cardiac catheterization that demonstrated moderate to severe left ventricle systolic dysfunction. He was considered a moderate risk for pre-and intraoperative complications from cardiac standpoint. Patient has had a previous CABG in 1988.  Dr. Delford Field came in to discuss pathology results. Patient and wife were notified of his benign results however, felt that this was  not a conclusive to totally rule out cancer. Patient was offered an appointment with  The thoracic surgeon for possible consideration of nodule removal with resection. However, patient declined this referral. At this time. We will set patient up for a close followup with CT scan in 6 weeks to evaluate the nodule.  Patient denies any chest pain, hemoptysis, syncopal episodes, edema, or shortness of breath.   05/25/2011 F/u CT 12/12: nodule is shrinking. Had a flareup with cough, URI type syndrome.  Pt rx per PCP. Now is better.  No real chest pain. Not fully over the URI:  Still coughing.  Cough is dry.    No real wheezing.       Past Medical History  Diagnosis Date  . Coronary artery disease     Remote CABG in 1988 with last cath in 2006, last stress test in 2010  . Hyperkalemia     with ACE inhibitor  . Hypertension     controlled with Amlodipine  . Hyperlipidemia   . History of nephrolithiasis   . Myocardial infarction     July 1988/ Anterior MI with CABG in 1989  . Prostate cancer     treated with radiation in 2000  . History of bladder cancer   . Abnormal nuclear cardiac imaging test 2010    with apical akinesia with prior infarct. No ischemia  . History of heart attack    History   Social History  . Marital Status:  Widowed    Spouse Name: N/A    Number of Children: N/A  . Years of Education: N/A   Occupational History  . RETIRED     Plumbing work   Social History Main Topics  . Smoking status: Former Smoker -- 0.8 packs/day for 55 years    Types: Cigarettes    Quit date: 05/16/1987  . Smokeless tobacco: Never Used  . Alcohol Use: No  . Drug Use: Not on file  . Sexually Active: Not on file   Other Topics Concern  . Not on file   Social History Narrative  . No narrative on file    Family History  Problem Relation Age of Onset  . Heart attack Mother   . Heart disease Father   . Coronary artery disease Sister   . Asthma Brother   . Asthma Sister   .  Emphysema Brother         Review of Systems  Constitutional:   No  weight loss, night sweats,  Fevers, chills, ++ fatigue, or  lassitude.  HEENT:   No headaches,  Difficulty swallowing,  Tooth/dental problems, or  Sore throat,                No sneezing, itching, ear ache, nasal congestion, post nasal drip,   CV:  No chest pain,  Orthopnea, PND, swelling in lower extremities, anasarca, dizziness, palpitations, syncope.   GI  No heartburn, indigestion, abdominal pain, nausea, vomiting, diarrhea, change in bowel habits, loss of appetite, bloody stools.   Resp: No shortness of breath with exertion or at rest.  No excess mucus, no productive cough,  No non-productive cough,  No coughing up of blood.  No change in color of mucus.  No wheezing.  No chest wall deformity  Skin: no rash or lesions.  GU: no dysuria, change in color of urine, no urgency or frequency.  No flank pain, no hematuria   MS:  No joint pain or swelling.  No decreased range of motion.  No back pain.  Psych:  No change in mood or affect. No depression or anxiety.  No memory loss.         Objective:   Physical Exam BP 122/72  Pulse 76  Temp(Src) 98.1 F (36.7 C) (Oral)  Ht 5\' 7"  (1.702 m)  Wt 141 lb (63.957 kg)  BMI 22.08 kg/m2  SpO2 96%  GEN: A/Ox3; pleasant , NAD, elderly /frail   HEENT:  [place]/AT,  EACs-clear, TMs-wnl, NOSE-clear, THROAT-clear, no lesions, no postnasal drip or exudate noted.   NECK:  Supple w/ fair ROM; no JVD; normal carotid impulses w/o bruits; no thyromegaly or nodules palpated; no lymphadenopathy.  RESP  Clear  P & A; w/o, wheezes/ rales/ or rhonchi.no accessory muscle use, no dullness to percussion  CARD:  RRR, 1-2 SM  , no peripheral edema, pulses intact, no cyanosis or clubbing.  GI:   Soft & nt; nml bowel sounds; no organomegaly or masses detected.  Musco: Warm bil, no deformities or joint swelling noted.   Neuro: alert, no focal deficits noted.    Skin: Warm, no  lesions or rashes   CT Chest 05/02/11: Reduction in size of RLL nodule to 2.5x 1.5 cm in diameter.      Assessment & Plan:  Granulomatous lung disease RLL Mass >> CT chest >>Mass within the superior segment of the right lower lobe, . Stable small pulmonary nodule in the right upper lobe compared with 2007. PET >>high uptake in  right lower lobe nodule , No mediastinal or hilar lymphadenopathy and no findings for  metastatic disease, 10 mm irregular right upper lobe pulmonary lesion anteriorly  demonstrates weakly positive FDG uptake  ENB bx 03/15/11 >path showed no malignant cells, granulamatous cells  Now RLL lesion is smaller, suspect benign, no need for resection  Plan  Repeat CT in 6 months Note tracheobronchitis already under Rx and improved. Pt to finish course of levaquin

## 2011-05-25 NOTE — Assessment & Plan Note (Signed)
RLL Mass >> CT chest >>Mass within the superior segment of the right lower lobe, . Stable small pulmonary nodule in the right upper lobe compared with 2007. PET >>high uptake in right lower lobe nodule , No mediastinal or hilar lymphadenopathy and no findings for  metastatic disease, 10 mm irregular right upper lobe pulmonary lesion anteriorly  demonstrates weakly positive FDG uptake  ENB bx 03/15/11 >path showed no malignant cells, granulamatous cells  Now RLL lesion is smaller, suspect benign, no need for resection  Plan  Repeat CT in 6 months Note tracheobronchitis already under Rx and improved. Pt to finish course of levaquin

## 2011-05-25 NOTE — Patient Instructions (Signed)
Take Levaquin one daily for 7days, Rx is at [facility] Repeat CT scan in 6months Return 6 months

## 2011-07-24 ENCOUNTER — Ambulatory Visit
Admission: RE | Admit: 2011-07-24 | Discharge: 2011-07-24 | Disposition: A | Discharge status: Home / Self Care | Payer: Medicare Other | Source: Ambulatory Visit | Attending: Endocrinology | Admitting: Endocrinology

## 2011-07-24 DIAGNOSIS — E042 Nontoxic multinodular goiter: Secondary | ICD-10-CM

## 2011-11-05 ENCOUNTER — Emergency Department (HOSPITAL_COMMUNITY): Payer: Medicare Other

## 2011-11-05 ENCOUNTER — Emergency Department (HOSPITAL_COMMUNITY)
Admission: EM | Admit: 2011-11-05 | Discharge: 2011-11-05 | Disposition: A | Discharge status: Home / Self Care | Payer: Medicare Other | Attending: Emergency Medicine | Admitting: Emergency Medicine

## 2011-11-05 ENCOUNTER — Encounter (HOSPITAL_COMMUNITY): Payer: Self-pay

## 2011-11-05 DIAGNOSIS — I2510 Atherosclerotic heart disease of native coronary artery without angina pectoris: Secondary | ICD-10-CM | POA: Insufficient documentation

## 2011-11-05 DIAGNOSIS — R001 Bradycardia, unspecified: Secondary | ICD-10-CM

## 2011-11-05 DIAGNOSIS — G319 Degenerative disease of nervous system, unspecified: Secondary | ICD-10-CM | POA: Insufficient documentation

## 2011-11-05 DIAGNOSIS — I252 Old myocardial infarction: Secondary | ICD-10-CM | POA: Insufficient documentation

## 2011-11-05 DIAGNOSIS — I498 Other specified cardiac arrhythmias: Secondary | ICD-10-CM | POA: Insufficient documentation

## 2011-11-05 DIAGNOSIS — Z79899 Other long term (current) drug therapy: Secondary | ICD-10-CM | POA: Insufficient documentation

## 2011-11-05 DIAGNOSIS — R55 Syncope and collapse: Secondary | ICD-10-CM | POA: Insufficient documentation

## 2011-11-05 DIAGNOSIS — I10 Essential (primary) hypertension: Secondary | ICD-10-CM | POA: Insufficient documentation

## 2011-11-05 DIAGNOSIS — R42 Dizziness and giddiness: Secondary | ICD-10-CM | POA: Insufficient documentation

## 2011-11-05 DIAGNOSIS — Z8546 Personal history of malignant neoplasm of prostate: Secondary | ICD-10-CM | POA: Insufficient documentation

## 2011-11-05 LAB — DIFFERENTIAL
Basophils Relative: 1 % (ref 0–1)
Lymphocytes Relative: 12 % (ref 12–46)
Monocytes Absolute: 0.5 10*3/uL (ref 0.1–1.0)
Monocytes Relative: 6 % (ref 3–12)
Neutro Abs: 7.1 10*3/uL (ref 1.7–7.7)
Neutrophils Relative %: 80 % — ABNORMAL HIGH (ref 43–77)

## 2011-11-05 LAB — CBC
HCT: 45.2 % (ref 39.0–52.0)
Hemoglobin: 15.4 g/dL (ref 13.0–17.0)
RBC: 4.87 MIL/uL (ref 4.22–5.81)
WBC: 8.9 10*3/uL (ref 4.0–10.5)

## 2011-11-05 LAB — BASIC METABOLIC PANEL
BUN: 16 mg/dL (ref 6–23)
CO2: 26 mEq/L (ref 19–32)
Chloride: 102 mEq/L (ref 96–112)
Creatinine, Ser: 1.14 mg/dL (ref 0.50–1.35)
GFR calc Af Amer: 66 mL/min — ABNORMAL LOW (ref >90)
Potassium: 4.5 mEq/L (ref 3.5–5.1)

## 2011-11-05 LAB — TROPONIN I: Troponin I: <0.3 ng/mL (ref <0.30)

## 2011-11-05 MED ORDER — ONDANSETRON HCL 4 MG/2ML IJ SOLN
INTRAMUSCULAR | Status: AC
  Filled 2011-11-05: qty 2

## 2011-11-05 MED ORDER — ACETAMINOPHEN 325 MG PO TABS
650.0000 mg | ORAL_TABLET | Freq: Once | ORAL | Status: AC
  Administered 2011-11-05: 650 mg via ORAL
  Filled 2011-11-05: qty 2

## 2011-11-05 NOTE — ED Provider Notes (Signed)
History     CSN: [id number]  Arrival date & time 11/05/11  1211   First MD Initiated Contact with Patient 11/05/11 1300      Chief Complaint  Patient presents with  . Nausea  . Dizziness     HPI Pt reports he felt dizzy and nauseas since 0730. Pt reports no longer nauseas, but continues to feel dizzy. Per EMS, VSS with BP 140's.  Patient denies chest pain.  Has had several spells similar to this over the last 2 months which are also associated with some flushing.  Patient also is complaining of a headache today.  Past Medical History  Diagnosis Date  . Coronary artery disease     Remote CABG in 1988 with last cath in 2006, last stress test in 2010  . Hyperkalemia     with ACE inhibitor  . Hypertension     controlled with Amlodipine  . Hyperlipidemia   . History of nephrolithiasis   . Myocardial infarction     July 1988/ Anterior MI with CABG in 1989  . Prostate cancer     treated with radiation in 2000  . History of bladder cancer   . Abnormal nuclear cardiac imaging test 2010    with apical akinesia with prior infarct. No ischemia  . History of heart attack     Past Surgical History  Procedure Date  . Cystoscopy w/ ureteral stent placement 03/11/08  . Appendectomy   . Lithotripsy     right sided  . Coronary artery bypass graft     01-05-88  . Cardiac catheterization 03/07/05  . Coronary angioplasty 01/20/03    left main stent     Family History  Problem Relation Age of Onset  . Heart attack Mother   . Heart disease Father   . Coronary artery disease Sister   . Asthma Brother   . Asthma Sister   . Emphysema Brother     History  Substance Use Topics  . Smoking status: Former Smoker -- 0.8 packs/day for 55 years    Types: Cigarettes    Quit date: 05/16/1987  . Smokeless tobacco: Never Used  . Alcohol Use: No      Review of Systems  All other systems reviewed and are negative.    Allergies  Review of patient's allergies indicates no known  allergies.  Home Medications   Current Outpatient Rx  Name Route Sig Dispense Refill  . ACETAMINOPHEN 325 MG PO TABS Oral Take 325 mg by mouth every 6 (six) hours as needed. For headache    . AMLODIPINE BESYLATE 2.5 MG PO TABS Oral Take 2.5 mg by mouth daily.      . ASPIRIN 81 MG PO TBEC Oral Take 81 mg by mouth daily.     . ATENOLOL 50 MG PO TABS Oral Take 50 mg by mouth daily.      Marland Kitchen LOVASTATIN 40 MG PO TABS Oral Take 40 mg by mouth at bedtime.    . ERGOCALCIFEROL 50000 UNITS PO CAPS Oral Take 50,000 Units by mouth as needed. Every 3 months       BP 159/68  Resp 14  SpO2 99%  Physical Exam  Nursing note and vitals reviewed. Constitutional: He is oriented to person, place, and time. He appears well-developed and well-nourished. No distress.  HENT:  Head: Normocephalic and atraumatic.  Eyes: Pupils are equal, round, and reactive to light.  Neck: Normal range of motion.  Cardiovascular: Normal rate and intact distal pulses.  Sinus bradycardia Rate = 55 Inferior infarct , age undetermined Anterolateral infarct , age undetermined Abnormal ECG  Pulmonary/Chest: No respiratory distress.  Abdominal: Normal appearance. He exhibits no distension.  Musculoskeletal: Normal range of motion.  Neurological: He is alert and oriented to person, place, and time. No cranial nerve deficit.  Skin: Skin is warm and dry. No rash noted.  Psychiatric: He has a normal mood and affect. His behavior is normal.    ED Course  Procedures (including critical care time)  Labs Reviewed  DIFFERENTIAL - Abnormal; Notable for the following:    Neutrophils Relative 80 (*)     All other components within normal limits  BASIC METABOLIC PANEL - Abnormal; Notable for the following:    Glucose, Bld 121 (*)     GFR calc non Af Amer 57 (*)     GFR calc Af Amer 66 (*)     All other components within normal limits  CBC  TROPONIN I   Ct Head Wo Contrast  11/05/2011  *RADIOLOGY REPORT*  Clinical Data:  Dizziness.  Nausea.  Headache.  CT HEAD WITHOUT CONTRAST  Technique:  Contiguous axial images were obtained from the base of the skull through the vertex without contrast.  Comparison: None.  Findings: Severe cortical and deep atrophy.  Severe changes of small vessel disease of the white matter diffusely.  Moderate cerebellar atrophy.  Physiologic calcifications in the basal ganglia. No mass lesion.  No midline shift.  No acute hemorrhage or hematoma.  No extra-axial fluid collections.  No evidence of acute infarction.  No skull fracture or other focal osseous abnormality involving the skull.  Fluid in the left mastoid air cells.  Right mastoid air cells and both middle ear cavities well-aerated.  Visualized paranasal sinuses well-aerated.  Extensive bilateral carotid siphon and to the artery atherosclerosis.  IMPRESSION:  1.  No acute intracranial abnormality. 2.  Severe generalized atrophy and severe chronic microvascular ischemic changes of the white matter diffusely. 3.  Left mastoid effusion.  Original Report Authenticated By: Arnell Sieving, M.D.     1. Dizzy spells   2. Bradycardia       MDM          Nelia Shi, MD 11/05/11 1540

## 2011-11-05 NOTE — ED Notes (Signed)
Pt states he lives with a friend who has also been nauseas and dizzy.

## 2011-11-05 NOTE — ED Notes (Signed)
Pt reports he felt dizzy and nauseas since 0730.  Pt reports no longer nauseas, but continues to feel dizzy.  Per EMS, VSS with BP 140's.

## 2011-11-05 NOTE — Discharge Instructions (Signed)
     Your blood pressure medicine they be causing a lot of your symptoms.  I would recommend that you consult with your doctor who prescribes her medicine and tell him the symptoms are having him get guidance on ingesting the dosage or changing the medication  Bradycardia You have a slow heart rate. This is called bradycardia. At rest, the normal heart rate is between 60-100 beats per minute. A slow heart may cause weakness, dizziness, loss of consciousness, and shortness of breath. This gets worse when you are active.  The medical causes of bradycardia can include:  Heart and thyroid problems.   High potassium.   Side effects of some medicines.  Well-trained athletes may have heart rates as slow as 42 beats per minute. Evaluation of bradycardia may require an electrocardiogram (ECG), blood tests, and possibly other heart studies.  Bradycardia due to heart disease can be a serious problem. Damage to the heart's electrical system may require a temporary or permanent pacemaker. Beta-blocker drugs, digoxin, and other medicines used to control blood pressure and heart rhythms will also slow the heart. These medicines may need to be used in lower doses, or be stopped, if you have problems from the bradycardia.  SEEK IMMEDIATE MEDICAL CARE IF:   You develop fainting, extreme weakness, shortness of breath, or fever.   You develop severe chest or abdominal pain, repeated vomiting, or dehydration.   You become sweaty and weak.  MAKE SURE YOU:   Understand these instructions.   Will watch your condition.   Will get help right away if you are not doing well or get worse.  Document Released: 05/01/2005 Document Revised: 04/20/2011 Document Reviewed: 07/29/2008 [facility] Patient Information 2012 [place], [place].

## 2011-11-20 ENCOUNTER — Encounter (HOSPITAL_COMMUNITY): Payer: Self-pay | Admitting: Pharmacy Technician

## 2011-11-24 ENCOUNTER — Telehealth: Payer: Self-pay | Admitting: *Deleted

## 2011-11-24 NOTE — Telephone Encounter (Signed)
The nodule was reduced in size in 11/12.  So ok to wait 1-2 months to repeat CT Chest

## 2011-11-24 NOTE — Telephone Encounter (Signed)
Message copied by [name] on Fri Nov 24, 2011  9:22 AM ------      Message from: [name]      Created: Thu Nov 23, 2011  4:33 PM       Find out if this pt had his Ct Chest done.  HE is due to get one to f/u lung nodule            pw      ----- Message -----         From: [name], MD         Sent: 05/25/2011  10:53 AM           To: Shan Levans, MD            Check CT

## 2011-11-24 NOTE — Telephone Encounter (Signed)
Called, spoke with pt - he is requesting I call his friend, Tyler Aas, regarding this.  Called, spoke with Tyler Aas.  Per Tyler Aas, pt does have a CT scheduled for July 19 at 10 am but states pt is having sweating spells to where he almost passes out.  States he has seen cardiologist about this and is scheduled to have a heart cath on this coming Tuesday at [facility].  Doris believes the CT should be put off for now bc of the problems pt is having.  She would like to know if Dr. Delford Field is in agreement with this.  If so, she would like to know when he feels would be a reasonable time for pt to try to have this rescheduled.  Dr. Delford Field, pls advise.  Thank you.

## 2011-11-24 NOTE — Telephone Encounter (Signed)
appt for ct 12/01/11 per Donata Duff Ottinger

## 2011-11-24 NOTE — Telephone Encounter (Signed)
Called, spoke with [name].  I informed her of below per Dr. Delford Field.  She verbalized understanding of this.  Advised I would send msg to PCCs to have them contact her to reschedule this from July 19 to 1-2 months out.  She verbalized understanding of this.  PCCs, Doris's #  is [phone] - will you pls call her to reschedule pt's CT Chest that is scheduled for July 19.  This can be rescheduled in 1-2 months.  Thank you.

## 2011-11-24 NOTE — Telephone Encounter (Signed)
I spoke with pt and is aware of this. Nothing further was needed and had no questions

## 2011-11-28 ENCOUNTER — Ambulatory Visit (HOSPITAL_COMMUNITY)
Admission: RE | Admit: 2011-11-28 | Discharge: 2011-11-29 | Disposition: A | Discharge status: Home / Self Care | Payer: Medicare Other | Source: Ambulatory Visit | Attending: Cardiology | Admitting: Cardiology

## 2011-11-28 ENCOUNTER — Encounter (HOSPITAL_COMMUNITY)
Admission: RE | Disposition: A | Discharge status: Home / Self Care | Payer: Self-pay | Source: Ambulatory Visit | Attending: Cardiology

## 2011-11-28 ENCOUNTER — Encounter (HOSPITAL_COMMUNITY): Payer: Self-pay | Admitting: Cardiology

## 2011-11-28 DIAGNOSIS — I2510 Atherosclerotic heart disease of native coronary artery without angina pectoris: Secondary | ICD-10-CM | POA: Insufficient documentation

## 2011-11-28 DIAGNOSIS — I25810 Atherosclerosis of coronary artery bypass graft(s) without angina pectoris: Secondary | ICD-10-CM | POA: Insufficient documentation

## 2011-11-28 DIAGNOSIS — I209 Angina pectoris, unspecified: Secondary | ICD-10-CM | POA: Insufficient documentation

## 2011-11-28 HISTORY — DX: Gastro-esophageal reflux disease without esophagitis: K21.9

## 2011-11-28 HISTORY — PX: PERCUTANEOUS CORONARY INTERVENTION-BALLOON ONLY: SHX6014

## 2011-11-28 HISTORY — PX: LEFT HEART CATHETERIZATION WITH CORONARY ANGIOGRAM: SHX5451

## 2011-11-28 SURGERY — LEFT HEART CATHETERIZATION WITH CORONARY ANGIOGRAM
Anesthesia: LOCAL

## 2011-11-28 MED ORDER — MIDAZOLAM HCL 2 MG/2ML IJ SOLN
INTRAMUSCULAR | Status: AC
  Filled 2011-11-28: qty 2

## 2011-11-28 MED ORDER — ONDANSETRON HCL 4 MG/2ML IJ SOLN: 4.0000 mg | Freq: Four times a day (QID) | INTRAMUSCULAR | Status: DC | PRN

## 2011-11-28 MED ORDER — SODIUM CHLORIDE 0.9 % IV SOLN: 1.0000 mL/kg/h | INTRAVENOUS | Status: AC

## 2011-11-28 MED ORDER — PANTOPRAZOLE SODIUM 40 MG PO TBEC
40.0000 mg | DELAYED_RELEASE_TABLET | Freq: Every day | ORAL | Status: DC
  Administered 2011-11-28 – 2011-11-29 (×2): 40 mg via ORAL
  Filled 2011-11-28 (×2): qty 1

## 2011-11-28 MED ORDER — BIVALIRUDIN BOLUS VIA INFUSION
0.1000 mg/kg | Freq: Once | INTRAVENOUS | Status: AC
  Administered 2011-11-28: 6.7 mg via INTRAVENOUS
  Filled 2011-11-28: qty 7

## 2011-11-28 MED ORDER — FENTANYL CITRATE 0.05 MG/ML IJ SOLN
INTRAMUSCULAR | Status: AC
  Filled 2011-11-28: qty 2

## 2011-11-28 MED ORDER — NITROGLYCERIN 0.2 MG/ML ON CALL CATH LAB
INTRAVENOUS | Status: AC
  Filled 2011-11-28: qty 1

## 2011-11-28 MED ORDER — SODIUM CHLORIDE 0.9 % IV SOLN
INTRAVENOUS | Status: DC
  Administered 2011-11-28: 1000 mL via INTRAVENOUS

## 2011-11-28 MED ORDER — LIDOCAINE HCL (PF) 1 % IJ SOLN
INTRAMUSCULAR | Status: AC
  Filled 2011-11-28: qty 30

## 2011-11-28 MED ORDER — SODIUM CHLORIDE 0.9 % IJ SOLN
3.0000 mL | Freq: Two times a day (BID) | INTRAMUSCULAR | Status: DC
  Administered 2011-11-28: 3 mL via INTRAVENOUS

## 2011-11-28 MED ORDER — OCUVITE PO TABS
1.0000 | ORAL_TABLET | Freq: Every day | ORAL | Status: DC
  Administered 2011-11-29: 09:00:00 1 via ORAL
  Filled 2011-11-28 (×2): qty 1

## 2011-11-28 MED ORDER — SODIUM CHLORIDE 0.9 % IV SOLN: 250.0000 mL | INTRAVENOUS | Status: DC | PRN

## 2011-11-28 MED ORDER — ASPIRIN 81 MG PO CHEW
324.0000 mg | CHEWABLE_TABLET | ORAL | Status: AC
  Administered 2011-11-28: 324 mg via ORAL
  Filled 2011-11-28: qty 4

## 2011-11-28 MED ORDER — CLOPIDOGREL BISULFATE 75 MG PO TABS
75.0000 mg | ORAL_TABLET | Freq: Every day | ORAL | Status: DC
  Administered 2011-11-29: 75 mg via ORAL
  Filled 2011-11-28: qty 1

## 2011-11-28 MED ORDER — SODIUM CHLORIDE 0.9 % IJ SOLN: 3.0000 mL | INTRAMUSCULAR | Status: DC | PRN

## 2011-11-28 MED ORDER — HEPARIN (PORCINE) IN NACL 2-0.9 UNIT/ML-% IJ SOLN
INTRAMUSCULAR | Status: AC
  Filled 2011-11-28: qty 2000

## 2011-11-28 MED ORDER — AMLODIPINE BESYLATE 2.5 MG PO TABS
2.5000 mg | ORAL_TABLET | Freq: Every day | ORAL | Status: DC
  Administered 2011-11-29: 09:00:00 2.5 mg via ORAL
  Filled 2011-11-28: qty 1

## 2011-11-28 MED ORDER — ACETAMINOPHEN 325 MG PO TABS: 650.0000 mg | ORAL_TABLET | ORAL | Status: DC | PRN

## 2011-11-28 MED ORDER — ASPIRIN EC 81 MG PO TBEC
81.0000 mg | DELAYED_RELEASE_TABLET | Freq: Every day | ORAL | Status: DC
  Administered 2011-11-29: 81 mg via ORAL
  Filled 2011-11-28 (×2): qty 1

## 2011-11-28 MED ORDER — SIMVASTATIN 5 MG PO TABS
5.0000 mg | ORAL_TABLET | Freq: Every day | ORAL | Status: DC
  Administered 2011-11-29: 09:00:00 5 mg via ORAL
  Filled 2011-11-28: qty 1

## 2011-11-28 MED ORDER — CLOPIDOGREL BISULFATE 300 MG PO TABS
ORAL_TABLET | ORAL | Status: AC
  Filled 2011-11-28: qty 2

## 2011-11-28 MED ORDER — ASPIRIN 81 MG PO TBEC: 81.0000 mg | DELAYED_RELEASE_TABLET | Freq: Every day | ORAL | Status: DC

## 2011-11-28 MED ORDER — ATENOLOL 50 MG PO TABS
50.0000 mg | ORAL_TABLET | Freq: Every day | ORAL | Status: DC
  Administered 2011-11-29: 09:00:00 50 mg via ORAL
  Filled 2011-11-28: qty 1

## 2011-11-28 MED ORDER — SODIUM CHLORIDE 0.9 % IV SOLN: 250.0000 mL | INTRAVENOUS | Status: DC

## 2011-11-28 MED ORDER — SODIUM CHLORIDE 0.9 % IJ SOLN: 3.0000 mL | Freq: Two times a day (BID) | INTRAMUSCULAR | Status: DC

## 2011-11-28 MED ORDER — ASPIRIN 81 MG PO CHEW: 324.0000 mg | CHEWABLE_TABLET | ORAL | Status: DC

## 2011-11-28 MED ORDER — SODIUM CHLORIDE 0.9 % IV SOLN
0.2500 mg/kg/h | INTRAVENOUS | Status: DC
  Administered 2011-11-28: 0.25 mg/kg/h via INTRAVENOUS
  Filled 2011-11-28: qty 250

## 2011-11-28 NOTE — Progress Notes (Signed)
Utilization Review Completed.Dominick Zertuche T7/16/2013   

## 2011-11-28 NOTE — CV Procedure (Addendum)
Procedure performed:  Left heart catheterization including hemodynamic monitoring of the left ventricle, selective right and left coronary arteriography. Left and right coronary arteriogram, SVG angiogram. PTCA and balloon angioplasty of CHRONIC TOTAL OCCLUSION OF LEFT MAIN AND PROXIMAL LAD.  Indication patient is a 78 year-old man with history of controlle hypertension (), hyperlipidemia, CAD and remote CABG and know occluded LM sten by cardiac cath in Aug 2012 (has atretic LIMA to  LAD)   who presents with dizziness, near syncope and diaphoresis with exertion which was felt to be anginal equavalent. Patient now brought to the catheterization lab to evaluate his coronary anatomy.  Hemodynamic data:  Left ventricular pressure was 150/7with LVEDP of 16 mm mercury. Aortic pressure was 150/58 with a mean of 90 mm mercury.  Left ventricle: Not performed. EF 30% from previous study  Right coronary artery: It is occluded in the proximal segment. It is dominant.   SVG to PDA: SVG to PDA is widely patent and gives collaterals to the occluded LAD.  Left main coronary artery: Left main coronary artery is large and left main stent is occluded including occlusion of the proximal LAD.  Circumflex coronary artery: A large vessel giving origin to a large obtuse marginal 1. The obtuse marginal 1 and 2 are occluded in the proximal segment and distally supplied by saphenous vein graft. The circumflex itself in the proximal segment has a calcific 40% stenosis.  SVG to OM 1 and OM 2: This saphenous vein graft is widely patent.  LAD:  LAD gives origin to a moderate sized D1 and D2 as evidenced by previous catheterization. It is occluded in the proximal segment. Well-visualized via right coronary artery collaterals, there appears to be proximal occlusion followed by a mid occlusion. The LAD prior to occlusion was very large caliber vessel.  LIMA to LAD: This is atretic since prior clinic catheterization in  2005.   Techinque of cardiac cath: Technique: Under sterile precautions using a 6 French right femoral arterial access, a 6 French sheath was introduced into the right femoral artery under fluoroscopy guidance. A 6 [place] multipurpose B2 catheter was advanced into the ascending aorta and then into the left ventricle. Hemodynamics are analysed. Catheter pulled into the ascending aorta and right coronary artery was cannulated, then left coronary artery was cannulated and angiography was performed in multiple views. The same catheter was utilized to engage the saphenous vein graft and angiography was performed. Catheter exchanged out of the body over J-Wire. NO immediate complications noted. Patient tolerated the procedure well.    Technique of intervention:  Using a 6 [place] XB 3.5 guide catheter theLM  coronary  was selected and cannulated. Using Angiomax for anticoagulation, I utilized a [employer] 6 guidewire and across the LM and proximal LAD coronary artery with moderate amount of difficulty. I placed the tip of the wire into the mid LAD  coronary artery. Angiography was performed.   The guidewire utilized was a long guidewire. I then utilized a 1.25 x 10 mm splinter Legend balloon which was over the wire balloon I tried to cross the mid LAD occlusion. Because of inability to do so I passed the balloon in the mid LAD in injected contrast through the lumen to confirm intraluminal position of the guidewire. Then multiple balloon antiplastic to performed throughout the mid and proximal LAD and left main coronary artery. I then utilized a 3.0 x 12 mm Trek balloon and multiple balloon and plasty is performed in the proximal LAD and left  main coronary artery 14 atmospheric pressure for 60-90 seconds. Intracoronary nitroglycerin was also administered. Repeat angiography revealed excellent results with 10% luminal irregularity in the left main and proximal LAD stent with TIMI 3 flow and there was a large  septal perforator and 2 moderate sized diagonals that were widely open now. At this point we abandoned the procedure.  Post-balloon angioplasty results were excellent with 0% residual stenoses and TIMI-3 flow was maintained. There was no evidence of edge dissection. The guidewire was withdrawn out of the body and the guide catheter was engaged and pulled out of the body over the J-wire the was no immediate complication. Patient tolerated the procedure well.  Disposition: Patient will be discharged in morning unless complications with out-patient follow up. Will need Dual antiplatelet therapy with  plavix and ASA 81 mg for now. If his symptoms improve I probably will leave him alone, however if symptomatology persist we can certainly consider bringing him back with an eye towards revascularization of CTO of the proximal and mid LAD with utilization of Crosser technology or a different technology. A total of 70 cc of contrast was utilized for diagnostic and interventional procedure.

## 2011-11-28 NOTE — H&P (Signed)
  Please see paper chart  

## 2011-11-28 NOTE — Interval H&P Note (Signed)
History and Physical Interval Note:  11/28/2011 8:57 AM  [name]  has presented today for surgery, with the diagnosis of CAD  The various methods of treatment have been discussed with the patient and family. After consideration of risks, benefits and other options for treatment, the patient has consented to  Procedure(s) (LRB): LEFT HEART CATHETERIZATION WITH CORONARY ANGIOGRAM (N/A) and possible angioplasty as a surgical intervention .  The patient's history has been reviewed, patient examined, no change in status, stable for surgery.  I have reviewed the patients' chart and labs.  Questions were answered to the patient's satisfaction.     Pamella Pert

## 2011-11-29 ENCOUNTER — Telehealth: Payer: Self-pay | Admitting: Critical Care Medicine

## 2011-11-29 LAB — CBC
MCH: 30.4 pg (ref 26.0–34.0)
MCHC: 33.2 g/dL (ref 30.0–36.0)
MCV: 91.7 fL (ref 78.0–100.0)
Platelets: 177 10*3/uL (ref 150–400)
RDW: 13.1 % (ref 11.5–15.5)

## 2011-11-29 LAB — BASIC METABOLIC PANEL
CO2: 23 mEq/L (ref 19–32)
Calcium: 9.3 mg/dL (ref 8.4–10.5)
Creatinine, Ser: 1.06 mg/dL (ref 0.50–1.35)
GFR calc Af Amer: 71 mL/min — ABNORMAL LOW (ref >90)
GFR calc non Af Amer: 61 mL/min — ABNORMAL LOW (ref >90)
Sodium: 138 mEq/L (ref 135–145)

## 2011-11-29 MED ORDER — CLOPIDOGREL BISULFATE 75 MG PO TABS: 75.0000 mg | ORAL_TABLET | Freq: Every day | ORAL | Status: AC

## 2011-11-29 NOTE — Progress Notes (Signed)
CARDIAC REHAB PHASE I   PRE:  Rate/Rhythm: 65SR  BP:  Supine: 155/78  Sitting:   Standing:    SaO2:   MODE:  Ambulation: 400 ft   POST:  Rate/Rhythem: 83SR  BP:  Supine:   Sitting: 161/79  Standing:    SaO2:  0725-0808 Pt walked 400 ft with asst x 1. Gait fairly steady. States does not use device for walking. Denied CP or dizziness. To recliner after walk. Declined CRP 2. Stated will walk on his own. Education completed. Encouraged pt to have friend read over diet and ex. Did not want to overwhelm pt with information so stressed most important points. Call bell in reach.  [name]

## 2011-11-29 NOTE — Telephone Encounter (Signed)
lmtcb

## 2011-11-29 NOTE — Plan of Care (Signed)
Problem: Phase II Progression Outcomes Goal: Discharge plan in place and appropriate Outcome: Completed/Met Date Met:  11/29/11 Plan discharge today  Goal: Pain controlled with appropriate interventions Outcome: Completed/Met Date Met:  11/29/11 Painfree Goal: Ambulates up to 600 ft. in hall x 1 Outcome: Completed/Met Date Met:  11/29/11 Ambulated in hall with cardiac rehab, tolerated activity well.  Goal: Tolerates diet Outcome: Completed/Met Date Met:  11/29/11 Appetite good, ate all breakfast and tolerated well.  Goal: Vascular site scale level 0 - I Vascular Site Scale Level 0: No bruising/bleeding/hematoma Level I (Mild): Bruising/Ecchymosis, minimal bleeding/ooozing, palpable hematoma < 3 cm Level II (Moderate): Bleeding not affecting hemodynamic parameters, pseudoaneurysm, palpable hematoma > 3 cm  Outcome: Completed/Met Date Met:  11/29/11 Groin site level 0  Pedal pulse palpable , no vascular issues noted Goal: Other Discharge Outcomes/Goals Outcome: Completed/Met Date Met:  11/29/11 Discharged to home today

## 2011-11-29 NOTE — Telephone Encounter (Signed)
Phone note from 11-24-11:  [name] 11/24/2011 10:33 AM Signed  appt for ct 12/01/11 per Lora Paula, RN 11/24/2011 10:27 AM Signed  [name], spoke with [name]. I informed her of below per Dr. Delford Field. She verbalized understanding of this. Advised I would send msg to PCCs to have them contact her to reschedule this from July 19 to 1-2 months out. She verbalized understanding of this.  PCCs, [name]'s # is [phone] - will you pls call her to reschedule pt's CT Chest that is scheduled for July 19. This can be rescheduled in 1-2 months. Thank you. Carver Fila, [place] 11/24/2011 10:14 AM Signed  I spoke with pt and is aware of this. Nothing further was needed and had no questions Shan Levans, MD 11/24/2011 10:11 AM Signed  The nodule was reduced in size in 11/12. So ok to wait 1-2 months to repeat CT Chest  Gweneth Dimitri, RN 11/24/2011 9:50 AM Signed  [name], spoke with pt - he is requesting I call his friend, [name], regarding this.  Called, spoke with [name]. Per [name], pt does have a CT scheduled for July 19 at 10 am but states pt is having sweating spells to where he almost passes out. States he has seen cardiologist about this and is scheduled to have a heart cath on this coming Tuesday at [facility]. [name] believes the CT should be put off for now bc of the problems pt is having. She would like to know if Dr. Delford Field is in agreement with this. If so, she would like to know when he feels would be a reasonable time for pt to try to have this rescheduled. Dr. Delford Field, pls advise. Thank you.  Gweneth Dimitri, RN 11/24/2011 9:22 AM Signed  Message copied by [name] on Fri Nov 24, 2011 9:22 AM  ------  Message from: [name]  Created: Thu Nov 23, 2011 4:33 PM  Find out if this pt had his Ct Chest done. HE is due to get one to f/u lung nodule  pw  ----- Message -----  From: [name], MD  Sent: 05/25/2011 10:53 AM  To: Shan Levans, MD  Check CT    [facility]  PLEASE HELP [facility] 12-01-11 CT TO 1-2 MONTHS OUT. Thanks.

## 2011-11-29 NOTE — Discharge Summary (Signed)
Physician Discharge Summary  Patient ID: [name] MRN: [id number] DOB/AGE: 1923-08-26 78 y.o.  Admit date: 11/28/2011 Discharge date: 11/29/2011  Primary Discharge Diagnosis: CAD of native and bypass arterial graft (LIMA) 2. PTCA and balloon angioplasty of LM and proximal LAD.  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  Significant Diagnostic Studies: Left heart catheterization including hemodynamic monitoring of the left ventricle, selective right and left coronary arteriography. Left and right coronary arteriogram, SVG angiogram. PTCA and balloon angioplasty of LM and proximal LAD.  Hospital Course: Patient admitted to the hospital an elective fashion. Due to the exertional component to dizziness fatigue and abnormal stress test that had revealed antral wall ischemia, he was now brought to the cardiac catheter should have. He underwent cardiac catheterization without any complications. The following day he remained asymptomatic, groin site was stable and hence was felt to be stable for discharge.   Discharge Exam: Blood pressure 154/97, pulse 57, temperature 97.3 F (36.3 C), temperature source Oral, resp. rate 16, height 5' 7.5" (1.715 m), weight 66.815 kg (147 lb 4.8 oz), SpO2 95.00%.    General appearance: alert, cooperative, appears stated age and no distress Resp: clear to auscultation bilaterally Cardio: regular rate and rhythm, S1, S2 normal, no murmur, click, rub or gallop Extremities: extremities normal, atraumatic, no cyanosis or edema Incision/Wound: No hematoma Labs:   Lab Results  Component Value Date   WBC 8.1 11/29/2011   HGB 14.0 11/29/2011   HCT 42.2 11/29/2011   MCV 91.7 11/29/2011   PLT 177 11/29/2011    Lab 11/29/11 0610  NA 138  K 3.9  CL 103  CO2 23  BUN 14  CREATININE 1.06  CALCIUM 9.3  PROT --  BILITOT --  ALKPHOS --  ALT --  AST --  GLUCOSE 98    Lipid Panel     Component Value Date/Time   CHOL 217* 08/02/2010 1742   TRIG 217* 08/02/2010 1742   HDL 40 08/02/2010 1742   CHOLHDL 5.4 08/02/2010 1742   VLDL 43* 08/02/2010 1742   LDLCALC 134* 08/02/2010 1742      Radiology: Ct Head Wo Contrast  11/05/2011  *RADIOLOGY REPORT*  Clinical Data: Dizziness.  Nausea.  Headache.  CT HEAD WITHOUT CONTRAST  Technique:  Contiguous axial images were obtained from the base of the skull through the vertex without contrast.  Comparison: None.  Findings: Severe cortical and deep atrophy.  Severe changes of small vessel disease of the white matter diffusely.  Moderate cerebellar atrophy.  Physiologic calcifications in the basal ganglia. No mass lesion.  No midline shift.  No acute hemorrhage or hematoma.  No extra-axial fluid collections.  No evidence of acute infarction.  No skull fracture or other focal osseous abnormality involving the skull.  Fluid in the left mastoid air cells.  Right mastoid air cells and both middle ear cavities well-aerated.  Visualized paranasal sinuses well-aerated.  Extensive bilateral carotid siphon and to the artery atherosclerosis.  IMPRESSION:  1.  No acute intracranial abnormality. 2.  Severe generalized atrophy and severe chronic microvascular ischemic changes of the white matter diffusely. 3.  Left mastoid effusion.  Original Report Authenticated By: Arnell Sieving, M.D.    EKG: Sinus bradycardia at a rate of 58 beats a minute. Left axis deviation, left anterior fascicular block, left hypertrophy. T wave inversion in anterior leads suggestive of ischemia. Cannot rule out old anterior myocardial infarction. No significant change from prior EKG from  yesterday. FOLLOW UP PLANS AND APPOINTMENTS Patient will follow up with me as scheduled in 2 weeks.  Discharge Orders    Future Appointments: Provider: Department: Dept Phone: Center:   12/01/2011 10:00 AM [facility] [phone] [facility]     Medication List   As of 11/29/2011  8:37 AM   TAKE these medications         acetaminophen 325 MG tablet   Commonly known as: TYLENOL   Take 325 mg by mouth every 6 (six) hours as needed. For headache      amLODipine 2.5 MG tablet   Commonly known as: NORVASC   Take 2.5 mg by mouth daily.      aspirin 81 MG EC tablet   Take 81 mg by mouth daily.      atenolol 50 MG tablet   Commonly known as: TENORMIN   Take 50 mg by mouth daily.      beta carotene w/minerals tablet   Take 1 tablet by mouth daily.      clopidogrel 75 MG tablet   Commonly known as: PLAVIX   Take 1 tablet (75 mg total) by mouth daily with breakfast.      CYANOCOBALAMIN IJ   Inject 1 mL as directed every 3 (three) months.      omeprazole 20 MG capsule   Commonly known as: PRILOSEC   Take 20 mg by mouth daily.      pravastatin 40 MG tablet   Commonly known as: PRAVACHOL   Take 40 mg by mouth daily.           Follow-up Information    Call Pamella Pert, MD. (Please keep the previously made appointment.)    Contact information:   [address]  [place] [address] [phone]           Pamella Pert, MD 11/29/2011, 8:37 AM

## 2011-11-30 NOTE — Telephone Encounter (Signed)
LMOAM for [name] to return my call to r/s ct chest. Ollen Gross

## 2011-11-30 NOTE — Telephone Encounter (Signed)
Pt is requesting that CT Chest appointment on 12/01/11 be moved out 1-2 months. This was ok'd by Dr. Delford Field, however, this has not been r/s. Called and left message on the below phone number for pt or caller to return my call at [phone], that I will be glad to R/S this appointment for them. Waiting on pt or caller to return my call. Rhonda J Cobb

## 2011-11-30 NOTE — Telephone Encounter (Signed)
Called and spoke with Tyler Aas (caller) and r/s CT Chest for Friday 01/12/12 at 10:30 at [facility]. CT Chest for 12/01/11 has been cancelled. Rhonda J Cobb

## 2011-12-01 ENCOUNTER — Other Ambulatory Visit: Payer: Medicare Other

## 2011-12-19 ENCOUNTER — Telehealth: Payer: Self-pay | Admitting: Critical Care Medicine

## 2011-12-19 NOTE — Telephone Encounter (Signed)
Called pt to schedule follow up apt x3.Sent letter 12/11/11. ° °

## 2012-01-09 ENCOUNTER — Telehealth: Payer: Self-pay | Admitting: *Deleted

## 2012-01-09 NOTE — Telephone Encounter (Signed)
Pt has decided to for go any further CTs of chest.  I am ok with this decision.  [name] Beeper  [phone]  Cell  [phone]  If no response or cell goes to voicemail, call beeper [phone]

## 2012-01-11 ENCOUNTER — Telehealth: Payer: Self-pay | Admitting: Critical Care Medicine

## 2012-01-11 NOTE — Telephone Encounter (Signed)
ATC  -  NA  -  Will try again later

## 2012-01-12 ENCOUNTER — Inpatient Hospital Stay: Admission: RE | Admit: 2012-01-12 | Payer: Medicare Other | Source: Ambulatory Visit

## 2012-01-12 NOTE — Telephone Encounter (Signed)
Called, spoke with Tyler Aas who is just a friend of pt who is trying to help him as she states that pt doesn't have any children and his family doesn't help.  Doris reports pt is having spells where he "turns real red, then he sweats, then he gets cold.  His clothes are real wet after this."  Tyler Aas states she will use an automatic BP cuff to try to check his BP and pulse during these spells but neither will register.  States she will immediately put the cuff on her and it works.  Reports these spells have been going on for months but have worsened since having cardiac cath in July.  States she has seen him have 3 in one night.  States pt was seen by PCP for this who was "ill because I took him in" and then was told to f/u with Dr. Nadara Eaton.  Pt was seen by Dr. Nadara Eaton - Tyler Aas states she was told Dr. Nadara Eaton was "unsure" what is going on and pt's "heart is wore out," and asked to f/u in 3 mo.  Tyler Aas is concerned because "these spells are really doing him in and working on his mind because you can see it in his eyes."  Pt scheduled for CT Chest on 9/3 - Doris would like to know 1) should pt proceed with the CT Chest given above -- could this possibly give any answers?  And 2) Would like to know if PW believes these symptoms could be cardiac related -- requesting PW's recs on what to do from this standpoint as she hasn't received any answers from cardiologist or PCP.  Dr. Delford Field, pls advise.  Thank you.  Note:  I offered to schedule pt OV as he was last seen 05/2011 -- f/u in 6 months.  Doris would like to wait on scheduling OV at this time until we receive PW's response and to see what the CT scan shows if this will still be done.

## 2012-01-13 NOTE — Telephone Encounter (Signed)
This is most likely cardiac in nature and not pulmonary in anyway.

## 2012-01-16 ENCOUNTER — Telehealth: Payer: Self-pay | Admitting: Critical Care Medicine

## 2012-01-16 ENCOUNTER — Ambulatory Visit (INDEPENDENT_AMBULATORY_CARE_PROVIDER_SITE_OTHER)
Admission: RE | Admit: 2012-01-16 | Discharge: 2012-01-16 | Disposition: A | Discharge status: Home / Self Care | Payer: Medicare Other | Source: Ambulatory Visit | Attending: Critical Care Medicine | Admitting: Critical Care Medicine

## 2012-01-16 DIAGNOSIS — R911 Solitary pulmonary nodule: Secondary | ICD-10-CM

## 2012-01-16 NOTE — Telephone Encounter (Signed)
Spoke with Tyler Aas and notified of recs per PW. She verbalized understanding and states nothing further needed.

## 2012-01-16 NOTE — Telephone Encounter (Signed)
I called the pt and told him CT Chest was stable  He asked me to speak to his friend and I told her the same info.

## 2012-01-18 ENCOUNTER — Ambulatory Visit: Payer: Medicare Other | Admitting: Critical Care Medicine

## 2012-01-24 ENCOUNTER — Telehealth: Payer: Self-pay | Admitting: Critical Care Medicine

## 2012-01-24 NOTE — Telephone Encounter (Signed)
error 

## 2012-09-03 IMAGING — CT CT HEAD W/O CM
1 of 2 series · 13 of 30 positions shown, 17 images · non-contrast
Comparison: None.

CLINICAL DATA: Dizziness.  Nausea.  Headache.

CT HEAD WITHOUT CONTRAST
TECHNIQUE: Contiguous axial images were obtained from the base of
the skull through the vertex without contrast.

[Series 2: brain · axial · 0.47mm/px · z∈[+126,+255]mm · 13 of 28 slices shown, 17 images]
[im 2/28  brain]
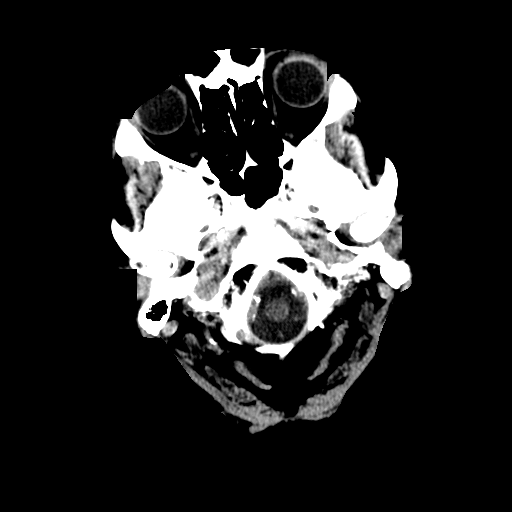
[im 2/28  bone]
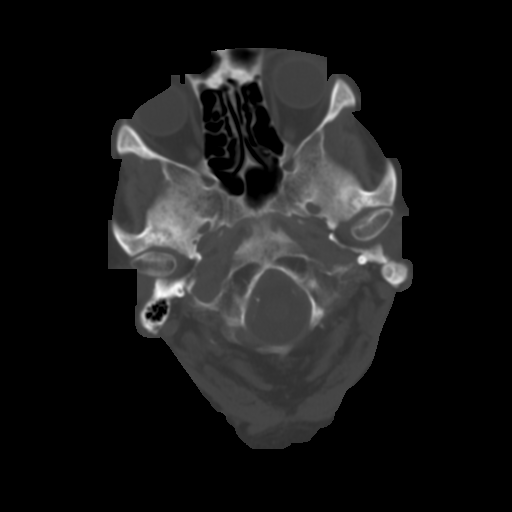
[im 4/28  brain]
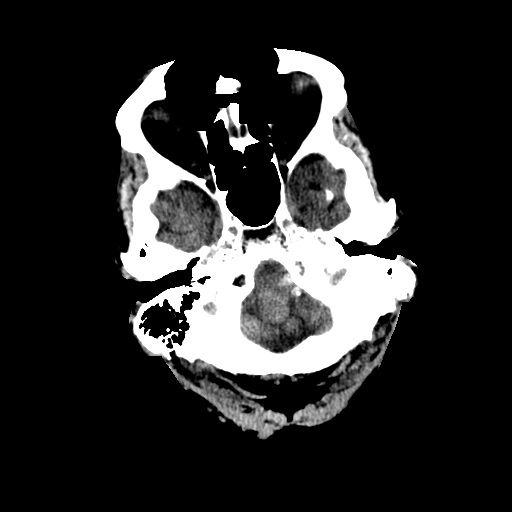
[im 6/28  brain]
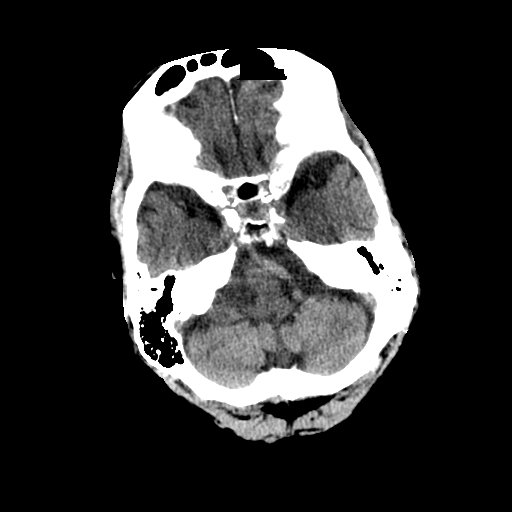
[im 8/28  brain]
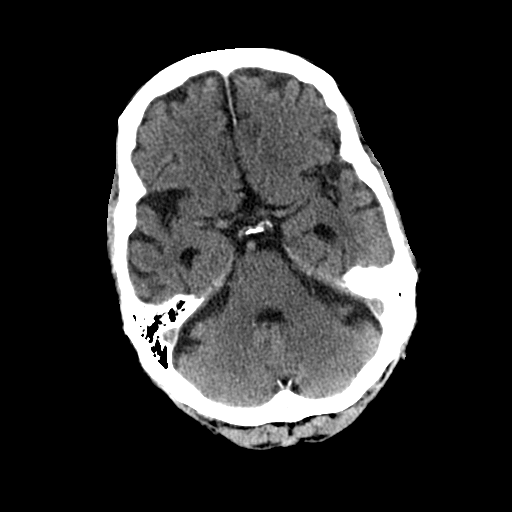
[im 10/28  brain]
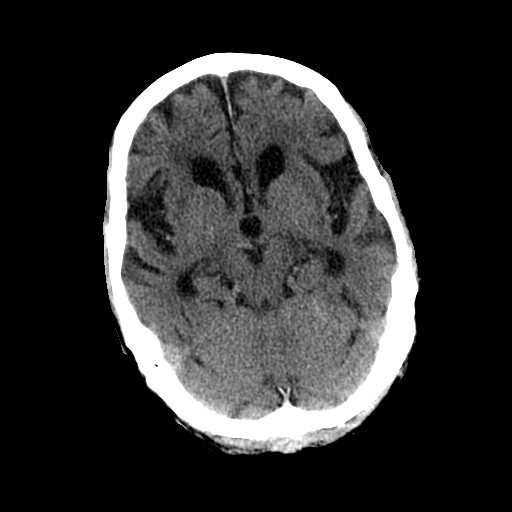
[im 10/28  bone]
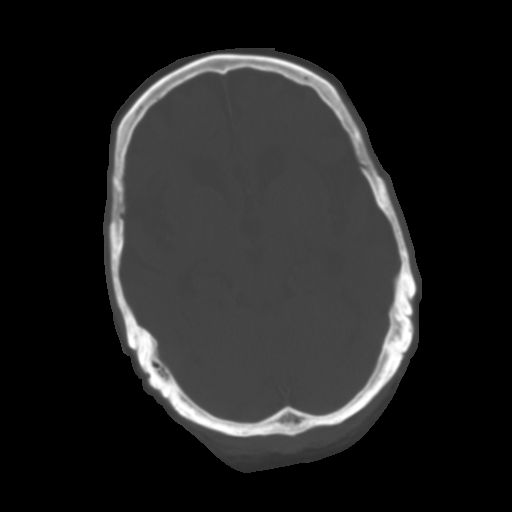
[im 12/28  brain]
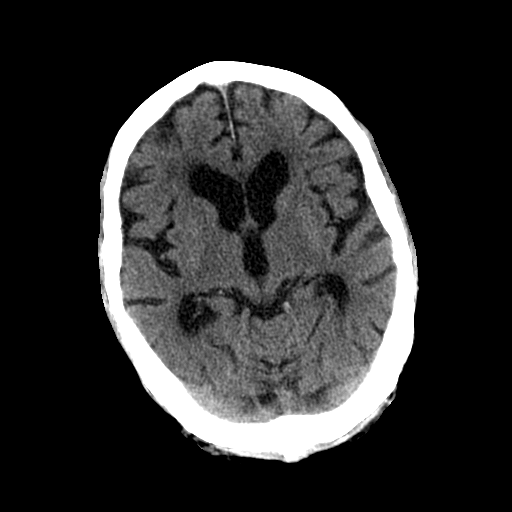
[im 14/28  brain]
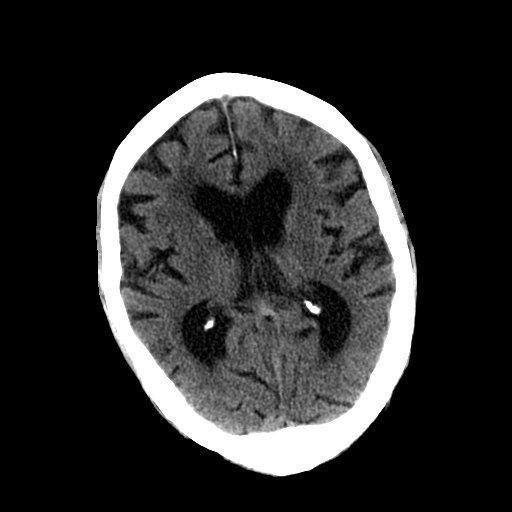
[im 16/28  brain]
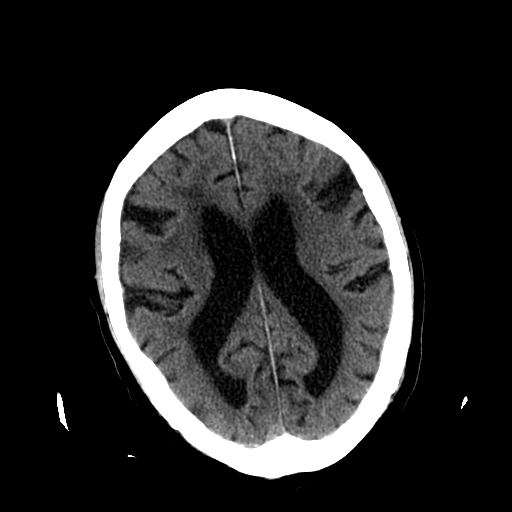
[im 18/28  brain]
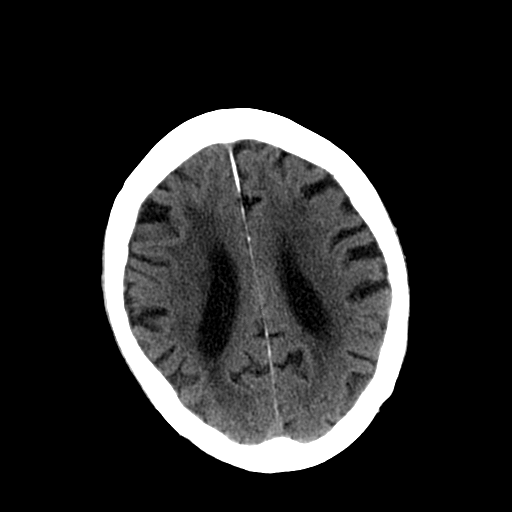
[im 18/28  bone]
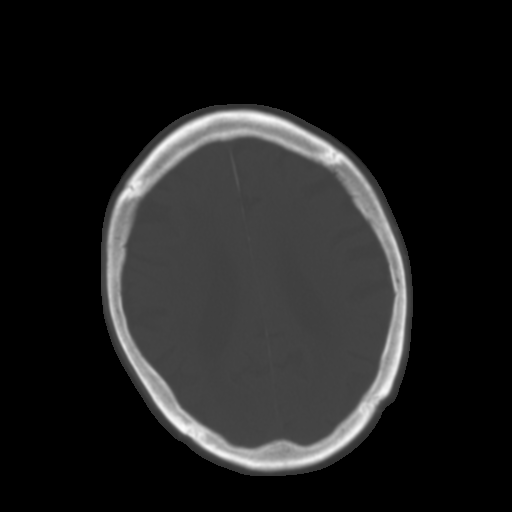
[im 20/28  brain]
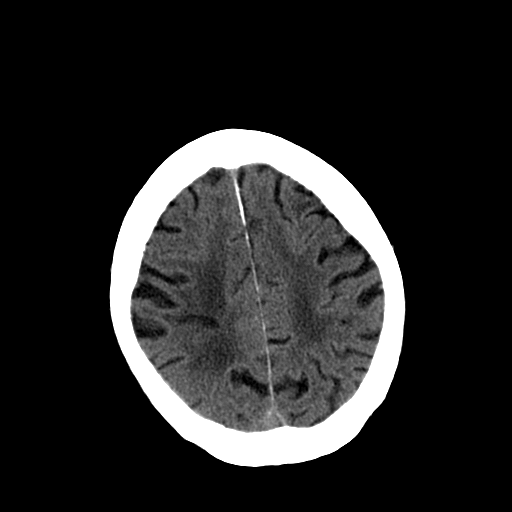
[im 22/28  brain]
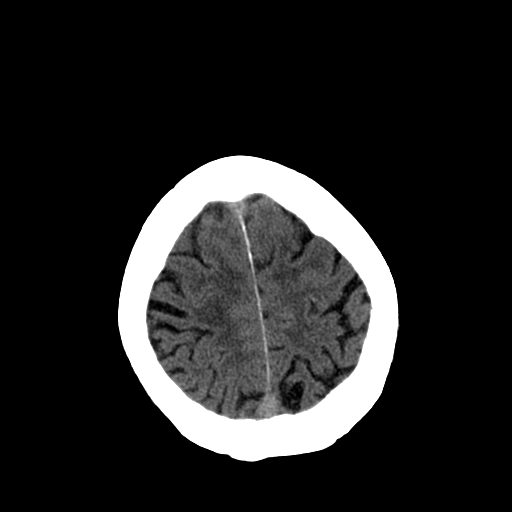
[im 24/28  brain]
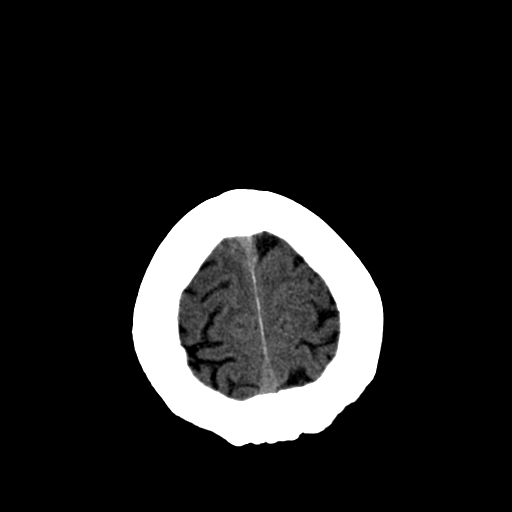
[im 26/28  brain]
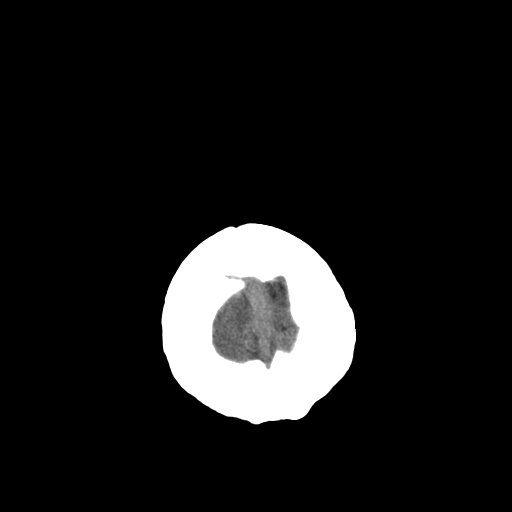
[im 26/28  bone]
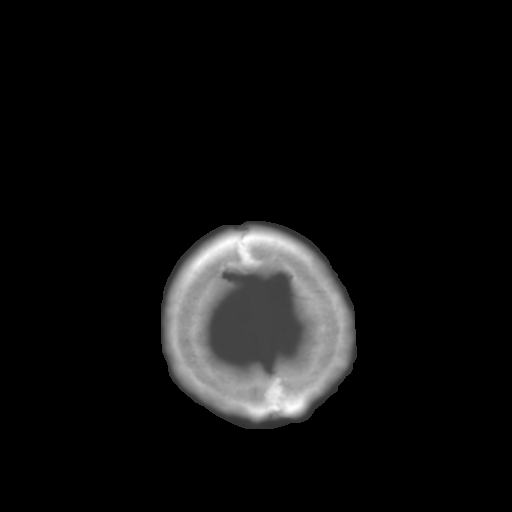

[13 of 30 positions shown; findings below may reference images not displayed]

FINDINGS: Severe cortical and deep atrophy.  Severe changes of
small vessel disease of the white matter diffusely.  Moderate
cerebellar atrophy.  Physiologic calcifications in the basal
ganglia. No mass lesion.  No midline shift.  No acute hemorrhage or
hematoma.  No extra-axial fluid collections.  No evidence of acute
infarction.

No skull fracture or other focal osseous abnormality involving the
skull.  Fluid in the left mastoid air cells.  Right mastoid air
cells and both middle ear cavities well-aerated.  Visualized
paranasal sinuses well-aerated.  Extensive bilateral carotid siphon
and to the artery atherosclerosis.
IMPRESSION: 1.  No acute intracranial abnormality.
2.  Severe generalized atrophy and severe chronic microvascular
ischemic changes of the white matter diffusely.
3.  Left mastoid effusion.

## 2012-10-18 ENCOUNTER — Encounter: Payer: Self-pay | Admitting: Cardiology

## 2012-10-21 ENCOUNTER — Encounter: Payer: Self-pay | Admitting: Cardiology

## 2013-02-02 ENCOUNTER — Encounter (HOSPITAL_COMMUNITY): Payer: Self-pay | Admitting: *Deleted

## 2013-02-02 ENCOUNTER — Emergency Department (HOSPITAL_COMMUNITY): Payer: Medicare Other

## 2013-02-02 ENCOUNTER — Emergency Department (HOSPITAL_COMMUNITY)
Admission: EM | Admit: 2013-02-02 | Discharge: 2013-02-02 | Disposition: A | Discharge status: Home / Self Care | Payer: Medicare Other | Attending: Emergency Medicine | Admitting: Emergency Medicine

## 2013-02-02 DIAGNOSIS — Z9889 Other specified postprocedural states: Secondary | ICD-10-CM | POA: Insufficient documentation

## 2013-02-02 DIAGNOSIS — I10 Essential (primary) hypertension: Secondary | ICD-10-CM | POA: Insufficient documentation

## 2013-02-02 DIAGNOSIS — I252 Old myocardial infarction: Secondary | ICD-10-CM | POA: Insufficient documentation

## 2013-02-02 DIAGNOSIS — Z79899 Other long term (current) drug therapy: Secondary | ICD-10-CM | POA: Insufficient documentation

## 2013-02-02 DIAGNOSIS — Z7982 Long term (current) use of aspirin: Secondary | ICD-10-CM | POA: Insufficient documentation

## 2013-02-02 DIAGNOSIS — Z87891 Personal history of nicotine dependence: Secondary | ICD-10-CM | POA: Insufficient documentation

## 2013-02-02 DIAGNOSIS — Z951 Presence of aortocoronary bypass graft: Secondary | ICD-10-CM | POA: Insufficient documentation

## 2013-02-02 DIAGNOSIS — Z9861 Coronary angioplasty status: Secondary | ICD-10-CM | POA: Insufficient documentation

## 2013-02-02 DIAGNOSIS — Z8551 Personal history of malignant neoplasm of bladder: Secondary | ICD-10-CM | POA: Insufficient documentation

## 2013-02-02 DIAGNOSIS — R002 Palpitations: Secondary | ICD-10-CM | POA: Diagnosis present

## 2013-02-02 DIAGNOSIS — Z8546 Personal history of malignant neoplasm of prostate: Secondary | ICD-10-CM | POA: Insufficient documentation

## 2013-02-02 DIAGNOSIS — Z923 Personal history of irradiation: Secondary | ICD-10-CM | POA: Insufficient documentation

## 2013-02-02 DIAGNOSIS — K219 Gastro-esophageal reflux disease without esophagitis: Secondary | ICD-10-CM | POA: Insufficient documentation

## 2013-02-02 DIAGNOSIS — Z87442 Personal history of urinary calculi: Secondary | ICD-10-CM | POA: Insufficient documentation

## 2013-02-02 DIAGNOSIS — I2510 Atherosclerotic heart disease of native coronary artery without angina pectoris: Secondary | ICD-10-CM | POA: Insufficient documentation

## 2013-02-02 LAB — CBC
MCV: 92.2 fL (ref 78.0–100.0)
Platelets: 218 10*3/uL (ref 150–400)
RDW: 13.3 % (ref 11.5–15.5)
WBC: 9.8 10*3/uL (ref 4.0–10.5)

## 2013-02-02 LAB — BASIC METABOLIC PANEL
Chloride: 103 mEq/L (ref 96–112)
Creatinine, Ser: 1.19 mg/dL (ref 0.50–1.35)
GFR calc Af Amer: 61 mL/min — ABNORMAL LOW (ref >90)
Sodium: 137 mEq/L (ref 135–145)

## 2013-02-02 LAB — URINALYSIS W MICROSCOPIC + REFLEX CULTURE
Bilirubin Urine: NEGATIVE
Ketones, ur: NEGATIVE mg/dL
Nitrite: NEGATIVE
Specific Gravity, Urine: 1.02 (ref 1.005–1.030)
Urobilinogen, UA: 0.2 mg/dL (ref 0.0–1.0)

## 2013-02-02 LAB — POCT I-STAT TROPONIN I: Troponin i, poc: 0.04 ng/mL (ref 0.00–0.08)

## 2013-02-02 LAB — PRO B NATRIURETIC PEPTIDE: Pro B Natriuretic peptide (BNP): 1422 pg/mL — ABNORMAL HIGH (ref 0–450)

## 2013-02-02 MED ORDER — SODIUM CHLORIDE 0.9 % IV BOLUS (SEPSIS)
500.0000 mL | INTRAVENOUS | Status: AC
  Administered 2013-02-02: 500 mL via INTRAVENOUS

## 2013-02-02 NOTE — ED Notes (Signed)
Discharge instructions reviewed with pt and family. Pt verbalized understanding.  

## 2013-02-02 NOTE — ED Notes (Signed)
Pt states yesterday he had rt sided cp that did no radiate, some sob and confusion. Pt reports no cp today but states his family thought he should come here to be checked out.

## 2013-02-02 NOTE — ED Notes (Signed)
Pt is here with fluttering in chest and states it is better now.  No chest pain or sob

## 2013-02-02 NOTE — ED Provider Notes (Signed)
CSN: [id number]     Arrival date & time 02/02/13  1146 History   First MD Initiated Contact with Patient 02/02/13 1224     Chief Complaint  Patient presents with  . Chest Fluttering    (Consider location/radiation/quality/duration/timing/severity/associated sxs/prior Treatment) Patient is a 79 y.o. male presenting with palpitations. The history is provided by the patient.  Palpitations Palpitations quality:  Unable to specify Onset quality:  Insidious Timing:  Intermittent Progression:  Unchanged Chronicity:  Recurrent Context comment:  At rest Relieved by:  Nothing Worsened by:  Nothing tried Ineffective treatments:  None tried Associated symptoms: no chest pain, no chest pressure, no cough, no nausea, no numbness, no shortness of breath, no syncope and no vomiting     Past Medical History  Diagnosis Date  . Coronary artery disease     Remote CABG in 1988 with last cath in 2006, last stress test in 2010  . Hyperkalemia     with ACE inhibitor  . Hypertension     controlled with Amlodipine  . Hyperlipidemia   . History of nephrolithiasis   . Myocardial infarction     July 1988/ Anterior MI with CABG in 1989  . Prostate cancer     treated with radiation in 2000  . History of bladder cancer   . Abnormal nuclear cardiac imaging test 2010    with apical akinesia with prior infarct. No ischemia  . History of heart attack   . GERD (gastroesophageal reflux disease)    Past Surgical History  Procedure Laterality Date  . Cystoscopy w/ ureteral stent placement  03/11/08  . Appendectomy    . Lithotripsy      right sided  . Coronary artery bypass graft      01-05-88  . Cardiac catheterization  03/07/05  . Coronary angioplasty  01/20/03    left main stent    Family History  Problem Relation Age of Onset  . Heart attack Mother   . Heart disease Father   . Coronary artery disease Sister   . Asthma Brother   . Asthma Sister   . Emphysema Brother    History  Substance  Use Topics  . Smoking status: Former Smoker -- 0.80 packs/day for 55 years    Types: Cigarettes    Quit date: 05/16/1987  . Smokeless tobacco: Never Used  . Alcohol Use: No    Review of Systems  Constitutional: Negative for fever.  HENT: Negative for rhinorrhea, drooling and neck pain.   Eyes: Negative for pain.  Respiratory: Negative for cough and shortness of breath.   Cardiovascular: Positive for palpitations. Negative for chest pain, leg swelling and syncope.  Gastrointestinal: Negative for nausea, vomiting, abdominal pain and diarrhea.  Genitourinary: Negative for dysuria and hematuria.  Musculoskeletal: Negative for gait problem.  Skin: Negative for color change.  Neurological: Negative for numbness and headaches.       Intermittent mild confusion.   Hematological: Negative for adenopathy.  Psychiatric/Behavioral: Negative for behavioral problems.  All other systems reviewed and are negative.    Allergies  Review of patient's allergies indicates no known allergies.  Home Medications   Current Outpatient Rx  Name  Route  Sig  Dispense  Refill  . acetaminophen (TYLENOL) 325 MG tablet   Oral   Take 325 mg by mouth every 6 (six) hours as needed. For headache         . amLODipine (NORVASC) 2.5 MG tablet   Oral   Take 2.5 mg by mouth  daily.           . aspirin 81 MG EC tablet   Oral   Take 81 mg by mouth daily.          Marland Kitchen atenolol (TENORMIN) 50 MG tablet   Oral   Take 50 mg by mouth daily.           . beta carotene w/minerals (OCUVITE) tablet   Oral   Take 1 tablet by mouth daily.         . CYANOCOBALAMIN IJ   Injection   Inject 1 mL as directed every 3 (three) months.         Marland Kitchen omeprazole (PRILOSEC) 20 MG capsule   Oral   Take 20 mg by mouth daily.         . pravastatin (PRAVACHOL) 40 MG tablet   Oral   Take 40 mg by mouth daily.          BP 145/76  Pulse 124  Temp(Src) 98.2 F (36.8 C) (Oral)  Resp 18  SpO2 97% Physical Exam   Nursing note and vitals reviewed. Constitutional: He appears well-developed and well-nourished.  HENT:  Head: Normocephalic and atraumatic.  Right Ear: External ear normal.  Left Ear: External ear normal.  Nose: Nose normal.  Mouth/Throat: Oropharynx is clear and moist. No oropharyngeal exudate.  Eyes: Conjunctivae and EOM are normal. Pupils are equal, round, and reactive to light.  Neck: Normal range of motion. Neck supple.  Cardiovascular: Normal rate, regular rhythm, normal heart sounds and intact distal pulses.  Exam reveals no gallop and no friction rub.   No murmur heard. Pulmonary/Chest: Effort normal and breath sounds normal. No respiratory distress. He has no wheezes.  Abdominal: Soft. Bowel sounds are normal. He exhibits no distension. There is no tenderness. There is no rebound and no guarding.  Musculoskeletal: Normal range of motion. He exhibits no edema and no tenderness.  Neurological: He is alert.  A/o x 2, does not know year.   Skin: Skin is warm and dry.  Psychiatric: He has a normal mood and affect. His behavior is normal.    ED Course  Procedures (including critical care time) Labs Review Labs Reviewed  BASIC METABOLIC PANEL - Abnormal; Notable for the following:    Glucose, Bld 116 (*)    GFR calc non Af Amer 53 (*)    GFR calc Af Amer 61 (*)    All other components within normal limits  PRO B NATRIURETIC PEPTIDE - Abnormal; Notable for the following:    Pro B Natriuretic peptide (BNP) 1422.0 (*)    All other components within normal limits  URINALYSIS W MICROSCOPIC + REFLEX CULTURE - Abnormal; Notable for the following:    Leukocytes, UA TRACE (*)    All other components within normal limits  CBC  POCT I-STAT TROPONIN I   Imaging Review Dg Chest 2 View  02/02/2013   *RADIOLOGY REPORT*  Clinical Data: Chest pressure, history of coronary artery disease  CHEST - 2 VIEW  Comparison: March 21, 2011, CT chest January 16, 2012  Findings: There is a 1.6 cm  nodule in the right suprahilar region. There is a 6 mm nodule in the right upper lobe.  The 6 mm nodule is probably stable compared to prior CT of January 16, 2012.  The 1.6 cm nodule is not seen on prior CT.  There is no focal pneumonia, pulmonary edema, or pleural effusion.  The aorta is tortuous.  The heart  size is normal.  The patient status post prior mediastinotomy.  The soft tissues and osseous structures are stable.  IMPRESSION: 1.6 cm nodule in the right suprahilar region not seen on the prior exams.  Further evaluation with chest CT on an outpatient basis is recommended.  No acute cardiopulmonary disease identified.   Original Report Authenticated By: Sherian Rein, M.D.     Date: 02/02/2013  Rate: 121  Rhythm: sinus tachycardia  QRS Axis: borderline LAD  Intervals: normal  ST/T Wave abnormalities: nonspecific ST changes  Conduction Disutrbances:none  Narrative Interpretation: old inf infarct, mild ST elev in V2 likely assoc w/ Rsr'  Old EKG Reviewed: changes noted   MDM   1. Palpitations    12:54 PM 79 y.o. male who presents with intermittent heart fluttering sensation since yesterday. One of his friends found him today in his kitchen and he was having some mild confusion. She notes that he has had slight worsening confusion intermittently over the past few weeks to months. The patient is alert and oriented x2 on exam and appears to be at baseline per patient's friend. Patient is afebrile and vital signs are now unremarkable on exam. He has no complaints at this time. He denies any chest pain or shortness of breath. HR on ecg shows sinus tach, now 99 on the monitor. Will get screening lab work, chest x-ray, light hydration w/ IVF and discuss case with cardiology.  4:00 PM: Discussed case w/ pt's Cardiologist [name]. Pt has had similar sx multiple times in the past. I interpreted/reviewed the labs and/or imaging which were non-contributory. As pt continues to appear well I think he  is stable for d/c home w/ close f/u.  I have discussed the diagnosis/risks/treatment options with the patient and believe the pt to be eligible for discharge home to follow-up with [name] tomorrow. We also discussed returning to the ED immediately if new or worsening sx occur. We discussed the sx which are most concerning (e.g., cp, sob, return of fluttering) that necessitate immediate return. Any new prescriptions provided to the patient are listed below.  New Prescriptions   No medications on file     Junius Argyle, MD 02/02/13 2133

## 2013-02-06 ENCOUNTER — Encounter: Payer: Self-pay | Admitting: *Deleted

## 2013-02-11 ENCOUNTER — Encounter: Payer: Self-pay | Admitting: Physician Assistant

## 2013-02-11 ENCOUNTER — Ambulatory Visit (INDEPENDENT_AMBULATORY_CARE_PROVIDER_SITE_OTHER): Payer: Medicare Other | Admitting: Physician Assistant

## 2013-02-11 VITALS — BP 127/64 | HR 88 | Ht 67.5 in | Wt 141.0 lb

## 2013-02-11 DIAGNOSIS — R413 Other amnesia: Secondary | ICD-10-CM

## 2013-02-11 DIAGNOSIS — I2589 Other forms of chronic ischemic heart disease: Secondary | ICD-10-CM

## 2013-02-11 DIAGNOSIS — I2510 Atherosclerotic heart disease of native coronary artery without angina pectoris: Secondary | ICD-10-CM

## 2013-02-11 DIAGNOSIS — I10 Essential (primary) hypertension: Secondary | ICD-10-CM

## 2013-02-11 NOTE — Patient Instructions (Addendum)
Your physician recommends that you continue on your current medications as directed. Please refer to the Current Medication list given to you today.  You have been referred to [facility]  Your physician recommends that you schedule a follow-up appointment in: 6-8 weeks with Dr.McLean

## 2013-02-11 NOTE — Progress Notes (Signed)
[address] [place], [place]  [address] Phone: [phone] Fax:  [phone]  Date:  02/11/2013   ID:  [name], DOB 1923/08/29, MRN [id number]  PCP:  Aida Puffer, MD  Cardiologist:  Dr. Yates Decamp    History of Present Illness: [name] is a 79 y.o. male who is here for the evaluation of "spells."  He is a prior patient of [name] and was to start seeing [name] after Dr. [name] retirement.  He was last seen in this office in 01/2011 by [name], NP.  He has a hx of CAD, s/p CABG, s/p stent to the LM into the pLAD, ICM with EF 30%, systolic CHF, HTN, HL, Lung nodules, thyroid nodule.  When last seen by [name], NP he complained of near syncope.  A holter monitor was done that was notable for sinus brady only.  He established with [name] some time after this and ultimately underwent LHC (7/13):  pRCA occluded, LM stent into the pLAD occluded, pOM1 occluded, pOM2 occluded, CFX 40%, S-PDA ok, S-OM1/OM2 ok, L-LAD atretic.  PCI:  POBA to the stent in the LM extending into the pLAD.  He has followed with [name] fairly closely.  He was seen in the ED 9/22 for reported palpitations.  He followed up with [name] last week.  He is here today with his sister and niece.  His niece is his healthcare POA.  He had been living by himself until she moved in recently.  A 3rd individual, who is not here today, arranged this appointment today.  She is apparently his girlfriend.  There is significant confusion today as to why the patient is being seen in our office today while he is actively followed by [name].    I spoke to [name] by phone.  He notes the patient has had "spells" for years that are assoc with confusion or possible syncope.  [name] has pursued event monitors often without significant arrhythmias noted.  The patient had classic angina that resolved after his PCI last year.  The patient has had worsening memory.  [name] recommended  referral to neurology.  The patient was mildly orthostatic in his office and his ACE inhibitor was stopped.  The patient's girlfriend apparently had a disagreement with [name] and canceled the neurology appt and arranged this appt today.  Patient is pleasantly confused.  He is not alert to place or time.  He is also not sure of his birth date.  The patient denies chest pain, shortness of breath, syncope, orthopnea, PND or significant pedal edema.  He has no recollection of this "spell" that occurred last week.  His family here with him today were not present.  There are reports of him drooling and being confused.  There was no reported syncope.  His BP was apparently elevated.  ECG in the ED was unchanged.  His CEs were normal.    Labs (9/14):  K 4.3, Cr 1.19, BNP 1422, TnI 0.04, Hgb 15.3   Wt Readings from Last 3 Encounters:  02/11/13 141 lb (63.957 kg)  11/29/11 147 lb 4.8 oz (66.815 kg)  11/29/11 147 lb 4.8 oz (66.815 kg)     Past Medical History  Diagnosis Date  . Hyperkalemia     with ACE inhibitor  . Hypertension     controlled with Amlodipine  . Hyperlipidemia   . History of nephrolithiasis   . Myocardial infarction  July 1988/ Anterior MI with CABG in 1989  . Prostate cancer     treated with radiation in 2000  . History of bladder cancer   . Abnormal nuclear cardiac imaging test 2010    with apical akinesia with prior infarct. No ischemia  . History of heart attack   . GERD (gastroesophageal reflux disease)   . Coronary artery disease     Remote CABG in 1988 with last cath in 2006, last stress test in 2010;  LHC (7/13):  pRCA occluded, LM stent into the pLAD occluded, pOM1 occluded, pOM2 occluded, CFX 40%, S-PDA ok, S-OM1/OM2 ok, L-LAD atretic.  PCI:  POBA to the stent in the LM extending into the pLAD    Current Outpatient Prescriptions  Medication Sig Dispense Refill  . aspirin 81 MG EC tablet Take 81 mg by mouth daily.       . beta carotene w/minerals (OCUVITE)  tablet Take 1 tablet by mouth daily.      . isosorbide dinitrate (ISORDIL) 10 MG tablet Take 10 mg by mouth 2 (two) times daily.      . metoprolol tartrate (LOPRESSOR) 25 MG tablet Take 25 mg by mouth 2 (two) times daily.      . nitroGLYCERIN (NITROSTAT) 0.4 MG SL tablet Place 0.4 mg under the tongue every 5 (five) minutes as needed for chest pain.       No current facility-administered medications for this visit.    Allergies:   No Known Allergies  Social History:  The patient  reports that he quit smoking about 25 years ago. His smoking use included Cigarettes. He has a 44 pack-year smoking history. He has never used smokeless tobacco. He reports that he does not drink alcohol or use illicit drugs.   ROS:  Please see the history of present illness.      All other systems reviewed and negative.   PHYSICAL EXAM: VS:  BP 127/64  Pulse 88  Ht 5' 7.5" (1.715 m)  Wt 141 lb (63.957 kg)  BMI 21.75 kg/m2 Well nourished, well developed, in no acute distress HEENT: normal Neck: no JVD Carotids:  No bruit Cardiac:  normal S1, S2; RRR; no murmur Lungs:  clear to auscultation bilaterally, no wheezing, rhonchi or rales Abd: soft, nontender, no hepatomegaly Ext: no edema Skin: warm and dry Neuro:  CNs 2-12 intact, no focal abnormalities noted  EKG:  NSR, HR 89, inf Q waves, AL Q waves     ASSESSMENT AND PLAN:  1. Confusion:  He likely has advanced dementia which may explain his witnessed "spells."  He has had an extensive workup that has been unremarkable.  I have requested records from [name].  I have recommended the patient be referred to neurology.  His healthcare POA is in agreement.   2. CAD:  Continue ASA, beta blocker, nitrates.  No angina.  Request records. Patient may f/u here or with [name].  He will decide. 3. Ischemic Cardiomyopathy:  ACE inhibitor stopped recently due to orthostasis.  Will need to consider restarting this over time.  Records from [name] requested.    4. Hypertension:  Controlled.  Continue current therapy.  5. Hyperlipidemia:  He is not on statin Rx.  Records have been requested.   6. Lung Nodules:  Previously followed by Dr. Delford Field.  These appeared to remain stable over the years with likely benign etiology. 7. Disposition:  Schedule follow up with Dr. Marca Ancona in 2 mos.  The patient will decide  with his healthcare POA if he wants to continue to see [name] or [name].  They will cancel the appt with [name] if they choose the former.  Signed, Tereso Newcomer, PA-C  02/11/2013 5:04 PM

## 2013-02-13 NOTE — Progress Notes (Signed)
I can see him if he wants. May be better for me to see given cardiomyopathy

## 2013-02-19 ENCOUNTER — Encounter: Payer: Self-pay | Admitting: Neurology

## 2013-02-19 ENCOUNTER — Ambulatory Visit: Payer: Medicare Other | Admitting: Neurology

## 2013-02-19 ENCOUNTER — Ambulatory Visit (INDEPENDENT_AMBULATORY_CARE_PROVIDER_SITE_OTHER): Payer: Medicare Other | Admitting: Neurology

## 2013-02-19 VITALS — BP 115/63 | HR 77 | Ht 67.0 in | Wt 140.0 lb

## 2013-02-19 DIAGNOSIS — F09 Unspecified mental disorder due to known physiological condition: Secondary | ICD-10-CM

## 2013-02-19 DIAGNOSIS — R4189 Other symptoms and signs involving cognitive functions and awareness: Secondary | ICD-10-CM | POA: Insufficient documentation

## 2013-02-19 MED ORDER — DONEPEZIL HCL 5 MG PO TABS: 5.0000 mg | ORAL_TABLET | Freq: Every day | ORAL | Status: DC

## 2013-02-19 NOTE — Progress Notes (Signed)
[facility]    Provider:  Dr Hosie Poisson Referring Provider: Aida Puffer, MD Primary Care Physician:  Aida Puffer, MD  CC:  Cognitive decline  HPI:  SHAKIL DIRK is a 79 y.o. male here as a referral from Dr. Clarene Duke for cognitive decline  Patient presents with wife and niece. Limited history provided. Per patient and wife, they believe he is doing well. They did not notice any trouble with his memory. Wife notes he is tired and sleeps a lot, but otherwise things he is doing fine. Denies any hallucinations. Denies any difficulty with memory short-term and remote. They deny any hallucinations. No difficulty walking, no trips or falls. He is getting B12 injections for the past 3-4 months for B12 deficiency. No prior history of strokes or TIAs, no family history of neurodegenerative disorder. He does have a history of heart attack. Needs note some difficulty with short-term memory, otherwise states he is doing fine. Unable to gather further history.  Per review of the notes from Dr. Clarene Duke, the patient has had a history of hallucinations and was admitted to the ER for this. The notes also mention that he is getting weaker and losing weight.  Review of Systems: Out of a complete 14 system review, the patient complains of only the following symptoms, and all other reviewed systems are negative. Positive for memory loss and confusion  History   Social History  . Marital Status: Widowed    Spouse Name: N/A    Number of Children: o  . Years of Education: 9th   Occupational History  . RETIRED     Plumbing work   Social History Main Topics  . Smoking status: Former Smoker -- 0.80 packs/day for 55 years    Types: Cigarettes    Quit date: 05/16/1987  . Smokeless tobacco: Never Used  . Alcohol Use: No  . Drug Use: No  . Sexual Activity: Not Currently   Other Topics Concern  . Not on file   Social History Narrative   Patient lives at home with sister Sheppard Plumber.    Patient is retired.    Patient does not have any children.   Patient has 9th grade education.    Family History  Problem Relation Age of Onset  . Heart attack Mother   . Heart disease Father   . Coronary artery disease Sister   . Asthma Brother   . Asthma Sister   . Emphysema Brother     Past Medical History  Diagnosis Date  . Hyperkalemia     with ACE inhibitor  . Hypertension     controlled with Amlodipine  . Hyperlipidemia   . History of nephrolithiasis   . Myocardial infarction     July 1988/ Anterior MI with CABG in 1989  . Prostate cancer     treated with radiation in 2000  . History of bladder cancer   . Abnormal nuclear cardiac imaging test 2010    with apical akinesia with prior infarct. No ischemia  . History of heart attack   . GERD (gastroesophageal reflux disease)   . Coronary artery disease     Remote CABG in 1988 with last cath in 2006, last stress test in 2010;  LHC (7/13):  pRCA occluded, LM stent into the pLAD occluded, pOM1 occluded, pOM2 occluded, CFX 40%, S-PDA ok, S-OM1/OM2 ok, L-LAD atretic.  PCI:  POBA to the stent in the LM extending into the pLAD    Past Surgical History  Procedure Laterality Date  .  Cystoscopy w/ ureteral stent placement  03/11/08  . Appendectomy    . Lithotripsy      right sided  . Coronary artery bypass graft      01-05-88  . Cardiac catheterization  03/07/05  . Coronary angioplasty  01/20/03    left main stent     Current Outpatient Prescriptions  Medication Sig Dispense Refill  . aspirin 81 MG EC tablet Take 81 mg by mouth daily.       . beta carotene w/minerals (OCUVITE) tablet Take 1 tablet by mouth daily.      . isosorbide dinitrate (ISORDIL) 10 MG tablet Take 10 mg by mouth 2 (two) times daily.      . metoprolol tartrate (LOPRESSOR) 25 MG tablet Take 25 mg by mouth 2 (two) times daily.      . nitroGLYCERIN (NITROSTAT) 0.4 MG SL tablet Place 0.4 mg under the tongue every 5 (five) minutes as needed for chest  pain.       No current facility-administered medications for this visit.    Allergies as of 02/19/2013  . (No Known Allergies)    Vitals: BP 115/63  Pulse 77  Ht 5\' 7"  (1.702 m)  Wt 140 lb (63.504 kg)  BMI 21.92 kg/m2 Last Weight:  Wt Readings from Last 1 Encounters:  02/19/13 140 lb (63.504 kg)   Last Height:   Ht Readings from Last 1 Encounters:  02/19/13 5\' 7"  (1.702 m)     Physical exam: Exam: Gen: NAD, conversant Eyes: anicteric sclerae, moist conjunctivae HENT: Atraumatic, oropharynx clear Neck: Trachea midline; supple,  Lungs: CTA, no wheezing, rales, rhonic                          CV: RRR, no MRG Abdomen: Soft, non-tender;  Extremities: No peripheral edema  Skin: Normal temperature, no rash,  Psych: Appropriate affect, pleasant  Neuro: MS: alert, pleasant  MMSE 15/30  +applause and glabellar sign  CN: PERRL, EOMI no nystagmus, no ptosis, sensation intact to LT V1-V3 bilat, face symmetric, no weakness, hearing grossly intact, palate elevates symmetrically, shoulder shrug 5/5 bilat,  tongue protrudes midline, no fasiculations noted.  Motor: normal bulk and tone Moves all extremities symmetrically  Coord: rapid alternating and point-to-point (FNF) movements intact.  Reflexes: symmetrical, bilat downgoing toes  Sens: LT intact in all extremities  Gait: upright, slow, decreased arm swing on L   Assessment:  After physical and neurologic examination, review of laboratory studies, imaging, neurophysiology testing and pre-existing records, assessment will be reviewed on the problem list.  Plan:  Treatment plan and additional workup will be reviewed under Problem List.  1)Cognitive decline  Mr Loeber is a pleasant 79y/o gentleman in for initial evaluation of cognitive decline. Family and patient I will provide much history. Unclear how long this has been going on for. Patient does have a known history of B12 deficiency for which she is receiving B12  injections. A review of his head CT from 2013 showed generalized white matter change and generalized atrophy. Suspect this is likely consistent with a combination of vascular dementia and a neurodegenerative process such as Alzheimer's dementia. Patient will continue to receive B12 injections with primary care physician. Will start patient on low-dose Aricept 5 mg nightly. Can titrate up in the future as tolerated. Follow up in 6 months

## 2013-02-19 NOTE — Patient Instructions (Signed)
Overall you are doing fairly well but I do want to suggest a few things today:   Remember to drink plenty of fluid, eat healthy meals and do not skip any meals. Try to eat protein with a every meal and eat a healthy snack such as fruit or nuts in between meals. Try to keep a regular sleep-wake schedule and try to exercise daily, particularly in the form of walking, 20-30 minutes a day, if you can.   As far as your medications are concerned, I would like to suggest starting a medication called Aricept. Start by taking 5mg  tablet nightly. The main side effects with this medication are nausea and upset stomach.   Continue to get your B12 injections with Dr Clarene Duke.   I would like to see you back in 6 months, sooner if we need to. Please call us with any interim questions, concerns, problems, updates or refill requests.   Please also call us for any test results so we can go over those with you on the phone.  My clinical assistant and will answer any of your questions and relay your messages to me and also relay most of my messages to you.   Our phone number is [phone]. We also have an after hours call service for urgent matters and there is a physician on-call for urgent questions. For any emergencies you know to call 911 or go to the nearest emergency room

## 2013-02-21 ENCOUNTER — Telehealth: Payer: Self-pay | Admitting: *Deleted

## 2013-02-21 NOTE — Telephone Encounter (Signed)
Niece, Ross Marcus called about aricept costing $188.00.  She asked about samples.  I told her that once drug generic, we done get samples.  I called and spoke to pharmacy, and they only had MCR, not BCBS supple.   I told her to call or go over with card this may help in cost.  She stated she will.

## 2013-03-09 ENCOUNTER — Encounter (HOSPITAL_COMMUNITY): Payer: Self-pay | Admitting: Emergency Medicine

## 2013-03-09 ENCOUNTER — Emergency Department (HOSPITAL_COMMUNITY): Payer: Medicare Other

## 2013-03-09 ENCOUNTER — Observation Stay (HOSPITAL_COMMUNITY)
Admission: EM | Admit: 2013-03-09 | Discharge: 2013-03-11 | Disposition: A | Discharge status: Home / Self Care | Payer: Medicare Other | Attending: Internal Medicine | Admitting: Internal Medicine

## 2013-03-09 DIAGNOSIS — Z7982 Long term (current) use of aspirin: Secondary | ICD-10-CM | POA: Insufficient documentation

## 2013-03-09 DIAGNOSIS — Z87891 Personal history of nicotine dependence: Secondary | ICD-10-CM | POA: Insufficient documentation

## 2013-03-09 DIAGNOSIS — R4189 Other symptoms and signs involving cognitive functions and awareness: Secondary | ICD-10-CM

## 2013-03-09 DIAGNOSIS — Z8551 Personal history of malignant neoplasm of bladder: Secondary | ICD-10-CM | POA: Insufficient documentation

## 2013-03-09 DIAGNOSIS — K219 Gastro-esophageal reflux disease without esophagitis: Secondary | ICD-10-CM | POA: Insufficient documentation

## 2013-03-09 DIAGNOSIS — E875 Hyperkalemia: Secondary | ICD-10-CM | POA: Insufficient documentation

## 2013-03-09 DIAGNOSIS — Z87442 Personal history of urinary calculi: Secondary | ICD-10-CM | POA: Insufficient documentation

## 2013-03-09 DIAGNOSIS — E785 Hyperlipidemia, unspecified: Secondary | ICD-10-CM | POA: Insufficient documentation

## 2013-03-09 DIAGNOSIS — R55 Syncope and collapse: Secondary | ICD-10-CM

## 2013-03-09 DIAGNOSIS — I2510 Atherosclerotic heart disease of native coronary artery without angina pectoris: Principal | ICD-10-CM

## 2013-03-09 DIAGNOSIS — I252 Old myocardial infarction: Secondary | ICD-10-CM | POA: Insufficient documentation

## 2013-03-09 DIAGNOSIS — I10 Essential (primary) hypertension: Secondary | ICD-10-CM

## 2013-03-09 DIAGNOSIS — Z8674 Personal history of sudden cardiac arrest: Secondary | ICD-10-CM | POA: Insufficient documentation

## 2013-03-09 DIAGNOSIS — L301 Dyshidrosis [pompholyx]: Secondary | ICD-10-CM | POA: Insufficient documentation

## 2013-03-09 DIAGNOSIS — Z8546 Personal history of malignant neoplasm of prostate: Secondary | ICD-10-CM | POA: Insufficient documentation

## 2013-03-09 DIAGNOSIS — F09 Unspecified mental disorder due to known physiological condition: Secondary | ICD-10-CM | POA: Insufficient documentation

## 2013-03-09 DIAGNOSIS — Z9861 Coronary angioplasty status: Secondary | ICD-10-CM | POA: Insufficient documentation

## 2013-03-09 DIAGNOSIS — Z79899 Other long term (current) drug therapy: Secondary | ICD-10-CM | POA: Insufficient documentation

## 2013-03-09 DIAGNOSIS — Z951 Presence of aortocoronary bypass graft: Secondary | ICD-10-CM | POA: Insufficient documentation

## 2013-03-09 LAB — CBC WITH DIFFERENTIAL/PLATELET
Basophils Relative: 0 % (ref 0–1)
Eosinophils Absolute: 0.2 10*3/uL (ref 0.0–0.7)
HCT: 47.2 % (ref 39.0–52.0)
Hemoglobin: 16.1 g/dL (ref 13.0–17.0)
Lymphs Abs: 1.2 10*3/uL (ref 0.7–4.0)
MCH: 31.3 pg (ref 26.0–34.0)
MCHC: 34.1 g/dL (ref 30.0–36.0)
Monocytes Absolute: 0.7 10*3/uL (ref 0.1–1.0)
Monocytes Relative: 6 % (ref 3–12)
Neutro Abs: 9.5 10*3/uL — ABNORMAL HIGH (ref 1.7–7.7)
Neutrophils Relative %: 82 % — ABNORMAL HIGH (ref 43–77)
RBC: 5.14 MIL/uL (ref 4.22–5.81)
RDW: 13.7 % (ref 11.5–15.5)

## 2013-03-09 LAB — COMPREHENSIVE METABOLIC PANEL
ALT: 14 U/L (ref 0–53)
AST: 26 U/L (ref 0–37)
Alkaline Phosphatase: 123 U/L — ABNORMAL HIGH (ref 39–117)
BUN: 20 mg/dL (ref 6–23)
CO2: 26 mEq/L (ref 19–32)
Chloride: 101 mEq/L (ref 96–112)
GFR calc Af Amer: 65 mL/min — ABNORMAL LOW (ref >90)
Glucose, Bld: 129 mg/dL — ABNORMAL HIGH (ref 70–99)
Potassium: 5.2 mEq/L — ABNORMAL HIGH (ref 3.5–5.1)
Sodium: 138 mEq/L (ref 135–145)
Total Bilirubin: 0.6 mg/dL (ref 0.3–1.2)
Total Protein: 7.1 g/dL (ref 6.0–8.3)

## 2013-03-09 LAB — POCT I-STAT TROPONIN I

## 2013-03-09 MED ORDER — NITROGLYCERIN 0.4 MG SL SUBL: 0.4000 mg | SUBLINGUAL_TABLET | SUBLINGUAL | Status: DC | PRN

## 2013-03-09 MED ORDER — ASPIRIN EC 81 MG PO TBEC
81.0000 mg | DELAYED_RELEASE_TABLET | Freq: Every day | ORAL | Status: DC
  Administered 2013-03-10: 81 mg via ORAL
  Filled 2013-03-09 (×2): qty 1

## 2013-03-09 MED ORDER — ACETAMINOPHEN 650 MG RE SUPP: 650.0000 mg | Freq: Four times a day (QID) | RECTAL | Status: DC | PRN

## 2013-03-09 MED ORDER — DONEPEZIL HCL 5 MG PO TABS
5.0000 mg | ORAL_TABLET | Freq: Every day | ORAL | Status: DC
  Administered 2013-03-10: 5 mg via ORAL
  Filled 2013-03-09 (×2): qty 1

## 2013-03-09 MED ORDER — SODIUM CHLORIDE 0.9 % IV SOLN
INTRAVENOUS | Status: AC
  Administered 2013-03-10: via INTRAVENOUS

## 2013-03-09 MED ORDER — HYDROCODONE-ACETAMINOPHEN 5-325 MG PO TABS: 1.0000 | ORAL_TABLET | ORAL | Status: DC | PRN

## 2013-03-09 MED ORDER — ONDANSETRON HCL 4 MG PO TABS: 4.0000 mg | ORAL_TABLET | Freq: Four times a day (QID) | ORAL | Status: DC | PRN

## 2013-03-09 MED ORDER — ENOXAPARIN SODIUM 40 MG/0.4ML ~~LOC~~ SOLN
40.0000 mg | SUBCUTANEOUS | Status: DC
  Administered 2013-03-10: 40 mg via SUBCUTANEOUS
  Filled 2013-03-09 (×2): qty 0.4

## 2013-03-09 MED ORDER — METOPROLOL TARTRATE 25 MG PO TABS
25.0000 mg | ORAL_TABLET | Freq: Two times a day (BID) | ORAL | Status: DC
  Administered 2013-03-10 (×3): 25 mg via ORAL
  Filled 2013-03-09 (×5): qty 1

## 2013-03-09 MED ORDER — DOCUSATE SODIUM 100 MG PO CAPS
100.0000 mg | ORAL_CAPSULE | Freq: Two times a day (BID) | ORAL | Status: DC
  Administered 2013-03-10 (×2): 100 mg via ORAL
  Filled 2013-03-09 (×5): qty 1

## 2013-03-09 MED ORDER — ONDANSETRON HCL 4 MG/2ML IJ SOLN: 4.0000 mg | Freq: Four times a day (QID) | INTRAMUSCULAR | Status: DC | PRN

## 2013-03-09 MED ORDER — ISOSORBIDE DINITRATE 10 MG PO TABS
10.0000 mg | ORAL_TABLET | Freq: Two times a day (BID) | ORAL | Status: DC
  Administered 2013-03-10 (×3): 10 mg via ORAL
  Filled 2013-03-09 (×5): qty 1

## 2013-03-09 MED ORDER — ACETAMINOPHEN 325 MG PO TABS: 650.0000 mg | ORAL_TABLET | Freq: Four times a day (QID) | ORAL | Status: DC | PRN

## 2013-03-09 MED ORDER — SODIUM CHLORIDE 0.9 % IJ SOLN
3.0000 mL | Freq: Two times a day (BID) | INTRAMUSCULAR | Status: DC
  Administered 2013-03-10 (×3): 3 mL via INTRAVENOUS

## 2013-03-09 NOTE — ED Provider Notes (Signed)
I saw and evaluated the patient, reviewed the resident's note and I agree with the findings and plan.  EKG Interpretation   AT time 16:57, SR at rate 67, inferior q waves noted, indicating prior inf infarct, anterior Q waves with poor r wave progression.  Normal axis, no ST abn's.  Abnormal ECG.  No sig change from ECG on 02/11/13.       Pt with witnessed syncope by sister from dinner table, was sitting, slumped over and fell out of chair.  Pt couldn't stand back up, sister unable to help him, EMS brought to the ED.  Pt has no focal defcitis, no CP here.  Pt reportedly mildly clammy at the time per sister.  Pt has h/o cardiac disease, ECG is unchanged, he has no CP, SOB, no diaphoresis at present.  Given age, possibility of arrythmia, will admit for syncope.  Hgb is ok, electrolytes are unremarkable.    Gavin Pound. Oletta Lamas, MD 03/13/13 2125

## 2013-03-09 NOTE — ED Notes (Signed)
Per EMS- pt comes from home where his family reports "he had a moment of weakness" were unable to elaborate further. Pt is negative on the stroke screen. Is denying any pain or complaints. Is alert to baseline with hx of alzheimers. Family reports this episode happened about a month ago and nothing was discovered, Pt was orthostatic sitting 134/72, HR 68- standing BP 112/70, HR 86. Report he hasn't been eating well.

## 2013-03-09 NOTE — ED Provider Notes (Signed)
CSN: [id number]     Arrival date & time 03/09/13  1649 History   First MD Initiated Contact with Patient 03/09/13 1749     Chief Complaint  Patient presents with  . Weakness   The history is provided by the patient, a caregiver and a relative. No language interpreter was used.    Mr. Saraceno is a 79 y.o. Caucasian male with past medical history of dementia and coronary artery disease who comes to the emergency department with weakness. He was sitting at the table eating lunch when he fell over to the left at on the floor. He was diaphoretic at the time and pale. He did not have any chest pain or shortness of breath. He did not lose consciousness. He had an episode similar to this approximately a month ago.  He was admitted at that time with a normal evaluation. The incident lasted for approximately a minute and then resolved completely. He was transported to the emergency department by EMS and had no acute events during transport. He has not been eating well over the past 2-3 weeks.   Past Medical History  Diagnosis Date  . Hyperkalemia     with ACE inhibitor  . Hypertension     controlled with Amlodipine  . Hyperlipidemia   . History of nephrolithiasis   . Myocardial infarction     July 1988/ Anterior MI with CABG in 1989  . Abnormal nuclear cardiac imaging test 2010    with apical akinesia with prior infarct. No ischemia  . History of heart attack   . GERD (gastroesophageal reflux disease)   . Coronary artery disease     Remote CABG in 1988 with last cath in 2006, last stress test in 2010;  LHC (7/13):  pRCA occluded, LM stent into the pLAD occluded, pOM1 occluded, pOM2 occluded, CFX 40%, S-PDA ok, S-OM1/OM2 ok, L-LAD atretic.  PCI:  POBA to the stent in the LM extending into the pLAD  . Prostate cancer     treated with radiation in 2000  . History of bladder cancer    Past Surgical History  Procedure Laterality Date  . Cystoscopy w/ ureteral stent placement  03/11/08  .  Appendectomy    . Lithotripsy      right sided  . Coronary artery bypass graft      01-05-88  . Cardiac catheterization  03/07/05  . Coronary angioplasty  01/20/03    left main stent    Family History  Problem Relation Age of Onset  . Heart attack Mother   . Heart disease Father   . Coronary artery disease Sister   . Asthma Brother   . Asthma Sister   . Emphysema Brother    History  Substance Use Topics  . Smoking status: Former Smoker -- 0.80 packs/day for 55 years    Types: Cigarettes    Quit date: 05/16/1987  . Smokeless tobacco: Never Used  . Alcohol Use: No    Review of Systems  Constitutional: Negative for fever and chills.  Respiratory: Negative for cough, chest tightness and shortness of breath.   Cardiovascular: Negative for chest pain.  Gastrointestinal: Negative for nausea, vomiting, abdominal pain, diarrhea and constipation.  Genitourinary: Negative for dysuria, urgency and frequency.  Neurological: Negative for seizures, weakness, numbness and headaches.  All other systems reviewed and are negative.    Allergies  Review of patient's allergies indicates no known allergies.  Home Medications   Current Outpatient Rx  Name  Route  Sig  Dispense  Refill  . aspirin 81 MG EC tablet   Oral   Take 81 mg by mouth daily.          . beta carotene w/minerals (OCUVITE) tablet   Oral   Take 1 tablet by mouth daily.         Marland Kitchen donepezil (ARICEPT) 5 MG tablet   Oral   Take 1 tablet (5 mg total) by mouth at bedtime.   30 tablet   3   . isosorbide dinitrate (ISORDIL) 10 MG tablet   Oral   Take 10 mg by mouth 2 (two) times daily.         . metoprolol tartrate (LOPRESSOR) 25 MG tablet   Oral   Take 25 mg by mouth 2 (two) times daily.         . nitroGLYCERIN (NITROSTAT) 0.4 MG SL tablet   Sublingual   Place 0.4 mg under the tongue every 5 (five) minutes as needed for chest pain.          BP 136/80  Pulse 74  Temp(Src) 97.9 F (36.6 C) (Oral)   Resp 19  SpO2 99% Physical Exam  Nursing note and vitals reviewed. Constitutional: He is oriented to person, place, and time. He appears well-developed and well-nourished. No distress.  HENT:  Head: Normocephalic and atraumatic.  Eyes: Pupils are equal, round, and reactive to light.  Neck: Normal range of motion.  Cardiovascular: Normal rate, regular rhythm and normal heart sounds.   Pulmonary/Chest: Effort normal and breath sounds normal. No respiratory distress. He has no decreased breath sounds. He has no wheezes. He has no rhonchi. He has no rales.  Abdominal: Soft. He exhibits no distension. There is no tenderness. There is no rebound and no guarding.  Musculoskeletal: He exhibits no edema and no tenderness.  Neurological: He is alert and oriented to person, place, and time. No cranial nerve deficit or sensory deficit. He exhibits normal muscle tone. Coordination and gait normal.  Skin: Skin is warm and dry.    ED Course  Procedures (including critical care time) Labs Review Labs Reviewed  CBC WITH DIFFERENTIAL - Abnormal; Notable for the following:    WBC 11.6 (*)    Neutrophils Relative % 82 (*)    Neutro Abs 9.5 (*)    Lymphocytes Relative 10 (*)    All other components within normal limits  COMPREHENSIVE METABOLIC PANEL - Abnormal; Notable for the following:    Potassium 5.2 (*)    Glucose, Bld 129 (*)    Albumin 3.4 (*)    Alkaline Phosphatase 123 (*)    GFR calc non Af Amer 56 (*)    GFR calc Af Amer 65 (*)    All other components within normal limits  URINALYSIS, ROUTINE W REFLEX MICROSCOPIC  POCT I-STAT TROPONIN I   Imaging Review Dg Chest 2 View  03/09/2013   CLINICAL DATA:  Weakness, hypertension.  EXAM: CHEST  2 VIEW  COMPARISON:  02/02/2013  FINDINGS: Previous CABG. Heart size normal. Tortuous atheromatous aorta. 13 mm right suprahilar nodule (previously 16 mm). Smaller right upper lobe nodule seen previously is no longer conspicuous. No new nodule or  infiltrate. No effusion.  IMPRESSION: Slight decrease in size of right suprahilar nodule. No new lesion identified.   Electronically Signed   By: Oley Balm M.D.   On: 03/09/2013 18:48    EKG Interpretation   None      Date: 03/09/2013  Rate: 67  Rhythm: normal sinus  rhythm  QRS Axis: normal  Intervals: normal  ST/T Wave abnormalities: nonspecific T wave changes  Conduction Disutrbances:none  Narrative Interpretation:   Old EKG Reviewed: unchanged    MDM   Mr. Bankhead is an 79 year old Caucasian male with past medical history of dementia and coronary artery disease who comes to the emergency department today with a presyncopal episode. Physical exam as above. Initial workup included an i-STAT troponin, CBC, CMP, chest x-ray, and EKG.  I-STAT troponin was negative. CBC and a C. of 11.6 otherwise unremarkable. CMP had potassium of 5.2, alkaline phosphatase of 123, a glucose of 129 otherwise unremarkable.  Chest x-ray demonstrated no cardiopulmonary disease, no pneumothorax, no evidence of pneumonia.  EKG as documented above. With history of syncopal event Mr. Doe was felt to require admission to the hospital.  He was admitted to the hospitalist service in stable condition.  Labs and imaging reviewed by myself and considered in medical decision making.  Imaging was interpreted by radiology.  His care was discussed with my attending Dr. Oletta Lamas.    1. Coronary artery disease   2. Hypertension   3. Syncope       Bethann Berkshire, MD 03/09/13 [phone]

## 2013-03-09 NOTE — H&P (Signed)
PCP: Aida Puffer, MD  Cardiology [facility]:   Chief Complaint:  Larey Seat out of the chair.   HPI: [name] is a 79 y.o. male   has a past medical history of Hyperkalemia; Hypertension; Hyperlipidemia; History of nephrolithiasis; Myocardial infarction; Abnormal nuclear cardiac imaging test (2010); History of heart attack; GERD (gastroesophageal reflux disease); Coronary artery disease; Prostate cancer; and History of bladder cancer.   Presented with  At 3:30 pm he was eating and fell over from the chair. He did not hit his head. Sister reports that he broke into sweat but the she thinks he did not lost conciseness. Patient was answering questions immediately after falling out and stating that he was ok but too week to get up./ This lasted for about 30 min. He did respond to questions. Denied shortness of breath or chest pain. By the time EMS arrived he was back to baseline. No seizure like activity. No slurred speech or localized weakness. Sisters state he have not eaten well.  No prodrome. He did have some nausea and vomiting 2 weeks ago but nothing recently. He has hx of Dementia.  Hospitalist was called for admission for further work up. Of note patient had had a Holter and event monitor in September which was only significant for occasional bradycardia.  Review of Systems:    Pertinent positives include: he has had a cough  Constitutional:  No weight loss, night sweats, Fevers, chills, fatigue, weight loss  HEENT:  No headaches, Difficulty swallowing,Tooth/dental problems,Sore throat,  No sneezing, itching, ear ache, nasal congestion, post nasal drip,  Cardio-vascular:  No chest pain, Orthopnea, PND, anasarca, dizziness, palpitations.no Bilateral lower extremity swelling  GI:  No heartburn, indigestion, abdominal pain, nausea, vomiting, diarrhea, change in bowel habits, loss of appetite, melena, blood in stool, hematemesis Resp:  no shortness of breath at rest. No dyspnea on  exertion, No excess mucus, no productive cough, No non-productive cough, No coughing up of blood.No change in color of mucus.No wheezing. Skin:  no rash or lesions. No jaundice GU:  no dysuria, change in color of urine, no urgency or frequency. No straining to urinate.  No flank pain.  Musculoskeletal:  No joint pain or no joint swelling. No decreased range of motion. No back pain.  Psych:  No change in mood or affect. No depression or anxiety. No memory loss.  Neuro: no localizing neurological complaints, no tingling, no weakness, no double vision, no gait abnormality, no slurred speech, no confusion  Otherwise ROS are negative except for above, 10 systems were reviewed  Past Medical History: Past Medical History  Diagnosis Date  . Hyperkalemia     with ACE inhibitor  . Hypertension     controlled with Amlodipine  . Hyperlipidemia   . History of nephrolithiasis   . Myocardial infarction     July 1988/ Anterior MI with CABG in 1989  . Abnormal nuclear cardiac imaging test 2010    with apical akinesia with prior infarct. No ischemia  . History of heart attack   . GERD (gastroesophageal reflux disease)   . Coronary artery disease     Remote CABG in 1988 with last cath in 2006, last stress test in 2010;  LHC (7/13):  pRCA occluded, LM stent into the pLAD occluded, pOM1 occluded, pOM2 occluded, CFX 40%, S-PDA ok, S-OM1/OM2 ok, L-LAD atretic.  PCI:  POBA to the stent in the LM extending into the pLAD  . Prostate cancer     treated with radiation in 2000  .  History of bladder cancer    Past Surgical History  Procedure Laterality Date  . Cystoscopy w/ ureteral stent placement  03/11/08  . Appendectomy    . Lithotripsy      right sided  . Coronary artery bypass graft      01-05-88  . Cardiac catheterization  03/07/05  . Coronary angioplasty  01/20/03    left main stent      Medications: Prior to Admission medications   Medication Sig Start Date End Date Taking? Authorizing  Provider  aspirin 81 MG EC tablet Take 81 mg by mouth daily.    Yes Historical Provider, MD  beta carotene w/minerals (OCUVITE) tablet Take 1 tablet by mouth daily.   Yes Historical Provider, MD  donepezil (ARICEPT) 5 MG tablet Take 1 tablet (5 mg total) by mouth at bedtime. 02/19/13  Yes Omelia Blackwater, DO  isosorbide dinitrate (ISORDIL) 10 MG tablet Take 10 mg by mouth 2 (two) times daily.   Yes Historical Provider, MD  metoprolol tartrate (LOPRESSOR) 25 MG tablet Take 25 mg by mouth 2 (two) times daily.   Yes Historical Provider, MD  nitroGLYCERIN (NITROSTAT) 0.4 MG SL tablet Place 0.4 mg under the tongue every 5 (five) minutes as needed for chest pain.   Yes Historical Provider, MD    Allergies:  No Known Allergies  Social History:  Ambulatory  independently   Lives at   Home with family   reports that he quit smoking about 25 years ago. His smoking use included Cigarettes. He has a 44 pack-year smoking history. He has never used smokeless tobacco. He reports that he does not drink alcohol or use illicit drugs.   Family History: family history includes Asthma in his brother and sister; Coronary artery disease in his sister; Emphysema in his brother; Heart attack in his mother; Heart disease in his father.    Physical Exam: Patient Vitals for the past 24 hrs:  BP Temp Temp src Pulse Resp SpO2  03/09/13 2100 124/67 mmHg - - 73 14 96 %  03/09/13 2015 133/69 mmHg - - 78 16 97 %  03/09/13 1930 144/72 mmHg - - 72 15 100 %  03/09/13 1919 - - - 100 22 100 %  03/09/13 1918 140/72 mmHg - - - - -  03/09/13 1916 140/72 mmHg 97.9 F (36.6 C) Oral 71 22 100 %  03/09/13 1815 145/78 mmHg - - 70 16 100 %  03/09/13 1659 140/72 mmHg 97.6 F (36.4 C) Oral 68 19 98 %    1. General:  in No Acute distress 2. Psychological: Alert   Oriented to self 3. Head/ENT:    Dry Mucous Membranes                          Head Non traumatic, neck supple                            Poor Dentition 4.  SKIN: normal  Skin turgor,  Skin clean Dry and intact no rash 5. Heart: Regular rate and rhythm no Murmur, Rub or gallop 6. Lungs: Clear to auscultation bilaterally, no wheezes or crackles   7. Abdomen: Soft, non-tender, Non distended 8. Lower extremities: no clubbing, cyanosis, or edema 9. Neurologically CN 2-12 intact strength 5/5 in all 4 extremities 10. MSK: Normal range of motion  body mass index is unknown because there is no weight on file.  Labs on Admission:   Recent Labs  03/09/13 1825  NA 138  K 5.2*  CL 101  CO2 26  GLUCOSE 129*  BUN 20  CREATININE 1.13  CALCIUM 10.0    Recent Labs  03/09/13 1825  AST 26  ALT 14  ALKPHOS 123*  BILITOT 0.6  PROT 7.1  ALBUMIN 3.4*   No results found for this basename: LIPASE, AMYLASE,  in the last 72 hours  Recent Labs  03/09/13 1825  WBC 11.6*  NEUTROABS 9.5*  HGB 16.1  HCT 47.2  MCV 91.8  PLT 261   No results found for this basename: CKTOTAL, CKMB, CKMBINDEX, TROPONINI,  in the last 72 hours No results found for this basename: TSH, T4TOTAL, FREET3, T3FREE, THYROIDAB,  in the last 72 hours No results found for this basename: VITAMINB12, FOLATE, FERRITIN, TIBC, IRON, RETICCTPCT,  in the last 72 hours Lab Results  Component Value Date   HGBA1C 6.0* 01/15/2011    The CrCl is unknown because both a height and weight (above a minimum accepted value) are required for this calculation. ABG    Component Value Date/Time   TCO2 27 04/01/2008 1011     No results found for this basename: DDIMER     Other results:  I have pearsonaly reviewed this: ECG REPORT  Rate: 6 7  Rhythm: Normal sinus rhythm ST&T Change: Q waves noted in leads ii, iii, aVF v2 v3   Cultures:    Component Value Date/Time   SDES BRONCHIAL WASHINGS 03/15/2011 1019   SDES BRONCHIAL WASHINGS 03/15/2011 1019   SDES BRONCHIAL WASHINGS 03/15/2011 1019   SPECREQUEST NONE 03/15/2011 1019   SPECREQUEST NONE 03/15/2011 1019   SPECREQUEST NONE  03/15/2011 1019   CULT NO GROWTH 2 DAYS 03/15/2011 1019   CULT No Fungi Isolated in 4 Weeks 03/15/2011 1019   CULT NO ACID FAST BACILLI ISOLATED IN 6 WEEKS 03/15/2011 1019   REPTSTATUS 03/17/2011 FINAL 03/15/2011 1019   REPTSTATUS 04/12/2011 FINAL 03/15/2011 1019   REPTSTATUS 04/27/2011 FINAL 03/15/2011 1019       Radiological Exams on Admission: Dg Chest 2 View  03/09/2013   CLINICAL DATA:  Weakness, hypertension.  EXAM: CHEST  2 VIEW  COMPARISON:  02/02/2013  FINDINGS: Previous CABG. Heart size normal. Tortuous atheromatous aorta. 13 mm right suprahilar nodule (previously 16 mm). Smaller right upper lobe nodule seen previously is no longer conspicuous. No new nodule or infiltrate. No effusion.  IMPRESSION: Slight decrease in size of right suprahilar nodule. No new lesion identified.   Electronically Signed   By: Oley Balm M.D.   On: 03/09/2013 18:48    Chart has been reviewed  Assessment/Plan 79 year old gentleman with history of coronary artery disease presents with questionable syncope.  Present on Admission:  . Syncope - not clear if it's true syncope but given presentation of diaphoresis and generalized weakness and elderly male with history of coronary disease will cycle cardiac markers S. EKG obtain echo gram and Dopplers. We'll check orthostatics. Since patient have had poor by mouth intake it gentle IV fluids recheck orthostatics in the morning.  Marland Kitchen Hypertension - continue home meds  . Coronary artery disease - continue aspirin and beta blocker monitor on telemetry   Prophylaxis:   Lovenox  CODE STATUS: Per family DO NOT RESUSCITATE DO NOT INTUBATE  Other plan as per orders.  I have spent a total of 55 min on this admission  Leilyn Frayre 03/09/2013, 9:19 PM

## 2013-03-09 NOTE — ED Notes (Signed)
Pt states "I just had a weak spell and that's all I know". Pt denies pain. Denies falling, LOC, CP, SOB, any other associated symptoms. Pt A&O.

## 2013-03-10 DIAGNOSIS — R55 Syncope and collapse: Secondary | ICD-10-CM

## 2013-03-10 DIAGNOSIS — F09 Unspecified mental disorder due to known physiological condition: Secondary | ICD-10-CM

## 2013-03-10 DIAGNOSIS — I517 Cardiomegaly: Secondary | ICD-10-CM

## 2013-03-10 LAB — COMPREHENSIVE METABOLIC PANEL
ALT: 11 U/L (ref 0–53)
AST: 20 U/L (ref 0–37)
Alkaline Phosphatase: 102 U/L (ref 39–117)
CO2: 25 mEq/L (ref 19–32)
GFR calc Af Amer: 69 mL/min — ABNORMAL LOW (ref >90)
GFR calc non Af Amer: 60 mL/min — ABNORMAL LOW (ref >90)
Glucose, Bld: 105 mg/dL — ABNORMAL HIGH (ref 70–99)
Potassium: 4 mEq/L (ref 3.5–5.1)
Sodium: 136 mEq/L (ref 135–145)
Total Protein: 6.2 g/dL (ref 6.0–8.3)

## 2013-03-10 LAB — CBC
HCT: 41.4 % (ref 39.0–52.0)
MCH: 31.4 pg (ref 26.0–34.0)
MCHC: 33.8 g/dL (ref 30.0–36.0)
MCV: 92.8 fL (ref 78.0–100.0)
Platelets: 234 10*3/uL (ref 150–400)
RDW: 13.7 % (ref 11.5–15.5)

## 2013-03-10 LAB — HEMOGLOBIN A1C: Mean Plasma Glucose: 120 mg/dL — ABNORMAL HIGH (ref <117)

## 2013-03-10 LAB — PHOSPHORUS: Phosphorus: 3.6 mg/dL (ref 2.3–4.6)

## 2013-03-10 LAB — TROPONIN I: Troponin I: <0.3 ng/mL (ref <0.30)

## 2013-03-10 LAB — MAGNESIUM: Magnesium: 1.9 mg/dL (ref 1.5–2.5)

## 2013-03-10 MED ORDER — ENSURE COMPLETE PO LIQD
237.0000 mL | Freq: Three times a day (TID) | ORAL | Status: DC
  Administered 2013-03-10: 237 mL via ORAL

## 2013-03-10 NOTE — Progress Notes (Signed)
*  PRELIMINARY RESULTS* Vascular Ultrasound Carotid Duplex (Doppler) has been completed.  Preliminary findings: Bilateral:  1-39% ICA stenosis.  Vertebral artery flow is antegrade.      Farrel Demark, RDMS, RVT  03/10/2013, 12:50 PM

## 2013-03-10 NOTE — Progress Notes (Signed)
UR Completed Jenney Brester Graves-Bigelow, RN,BSN [phone]  

## 2013-03-10 NOTE — Progress Notes (Signed)
Echocardiogram 2D Echocardiogram has been performed.  [name] 03/10/2013, 1:10 PM

## 2013-03-10 NOTE — Progress Notes (Signed)
[facility] PROGRESS NOTE  [name]:[id number] DOB: 1924/01/27 DOA: 03/09/2013  PCP: Aida Puffer, MD  Brief HPI: [name] presented with a fall and possible near syncope though it was not clear. Of note patient had had a Holter and event monitor in September which was only significant for occasional bradycardia. He was started on Aricept recently.  Past medical history:  Past Medical History  Diagnosis Date  . Hyperkalemia     with ACE inhibitor  . Hypertension     controlled with Amlodipine  . Hyperlipidemia   . History of nephrolithiasis   . Myocardial infarction     July 1988/ Anterior MI with CABG in 1989  . Abnormal nuclear cardiac imaging test 2010    with apical akinesia with prior infarct. No ischemia  . History of heart attack   . GERD (gastroesophageal reflux disease)   . Coronary artery disease     Remote CABG in 1988 with last cath in 2006, last stress test in 2010;  LHC (7/13):  pRCA occluded, LM stent into the pLAD occluded, pOM1 occluded, pOM2 occluded, CFX 40%, S-PDA ok, S-OM1/OM2 ok, L-LAD atretic.  PCI:  POBA to the stent in the LM extending into the pLAD  . Prostate cancer     treated with radiation in 2000  . History of bladder cancer     Consultants: None  Procedures:  2D ECHO and Carotid Dopplers are pending  Antibiotics: None  Subjective: Patient feels well. He is confused at times. Denies any chest pain or shortness of breath.  Objective: Vital Signs  Filed Vitals:   03/10/13 1000 03/10/13 1002 03/10/13 1004 03/10/13 1009  BP: 123/67 117/71 118/73 123/75  Pulse: 80 98 105 104  Temp:      TempSrc:      Resp:      Height:      Weight:      SpO2: 94% 98% 95% 96%    Intake/Output Summary (Last 24 hours) at 03/10/13 1225 Last data filed at 03/10/13 0900  Gross per 24 hour  Intake    360 ml  Output      0 ml  Net    360 ml   Filed Weights   03/09/13 2228  Weight: 59.966 kg (132 lb 3.2 oz)    General  appearance: alert, cooperative, appears stated age and no distress Head: Normocephalic, without obvious abnormality, atraumatic Resp: clear to auscultation bilaterally Cardio: regular rate and rhythm, S1, S2 normal, no murmur, click, rub or gallop GI: soft, non-tender; bowel sounds normal; no masses,  no organomegaly Extremities: extremities normal, atraumatic, no cyanosis or edema Neurologic: Alert. Thought he was in a hospital in a different town. No focal deficits.  Lab Results:  Basic Metabolic Panel:  Recent Labs Lab 03/09/13 1825 03/10/13 0535  NA 138 136  K 5.2* 4.0  CL 101 101  CO2 26 25  GLUCOSE 129* 105*  BUN 20 17  CREATININE 1.13 1.08  CALCIUM 10.0 8.9  MG  --  1.9  PHOS  --  3.6   Liver Function Tests:  Recent Labs Lab 03/09/13 1825 03/10/13 0535  AST 26 20  ALT 14 11  ALKPHOS 123* 102  BILITOT 0.6 0.7  PROT 7.1 6.2  ALBUMIN 3.4* 3.0*   CBC:  Recent Labs Lab 03/09/13 1825 03/10/13 0535  WBC 11.6* 8.0  NEUTROABS 9.5*  --   HGB 16.1 14.0  HCT 47.2 41.4  MCV 91.8 92.8  PLT 261 234  Cardiac Enzymes:  Recent Labs Lab 03/10/13 0535  TROPONINI <0.30   BNP (last 3 results)  Recent Labs  02/02/13 1316  PROBNP 1422.0*   Studies/Results: Dg Chest 2 View  03/09/2013   CLINICAL DATA:  Weakness, hypertension.  EXAM: CHEST  2 VIEW  COMPARISON:  02/02/2013  FINDINGS: Previous CABG. Heart size normal. Tortuous atheromatous aorta. 13 mm right suprahilar nodule (previously 16 mm). Smaller right upper lobe nodule seen previously is no longer conspicuous. No new nodule or infiltrate. No effusion.  IMPRESSION: Slight decrease in size of right suprahilar nodule. No new lesion identified.   Electronically Signed   By: Oley Balm M.D.   On: 03/09/2013 18:48    Medications:  Scheduled: . aspirin EC  81 mg Oral Daily  . docusate sodium  100 mg Oral BID  . enoxaparin (LOVENOX) injection  40 mg Subcutaneous Q24H  . isosorbide dinitrate  10 mg Oral  BID  . metoprolol tartrate  25 mg Oral BID  . sodium chloride  3 mL Intravenous Q12H   Continuous:  Gill:[id number], acetaminophen, HYDROcodone-acetaminophen, nitroGLYCERIN, ondansetron (ZOFRAN) IV, ondansetron  Assessment/Plan:  Active Problems:   Coronary artery disease   Hypertension   Syncope    Fall/Near Syncope He is now asymptomatic. Orthostatics were checked and BP did not change with position but HR did climb slightly. Troponins are normal. EKG shows no new changes. It appears that he was started on Aricept recently by his neurologist. Aricept can cause syncopal episodes. Will stop it for now. Obtain PT/OT evaluation. Saline lock IV.  Hypertension Stable as above. Monitor.  Coronary artery disease Recent PTCA to LM and LAD by [name] in July. Remains stable.   Dementia Vascular plus degenerative. Aricept stopped for now as it could have contributed to presentation. Can follow up with neurology for alternative agents or can be rechallenged again with Aricept.   Code Status: DNR  DVT Prophylaxis: Lovenox    Family Communication: No family at bedisde  Disposition Plan: Await PT/OT evaluation. Possible DC in AM. Await ECHo and dopplers.    LOS: 1 day   [facility]  [facility] Pager [phone] 03/10/2013, 12:25 PM  If 8PM-8AM, please contact night-coverage at [email or web address], password [facility]

## 2013-03-10 NOTE — Evaluation (Signed)
Occupational Therapy Evaluation Patient Details Name: [name] MRN: [id number] DOB: [birth year] Today's Date: 03/10/2013 Time: 7846-9629 OT Time Calculation (min): 30 min  OT Assessment / Plan / Recommendation History of present illness [name] is an 79 yo with PMH of dementia and coronary artery disease who was admitted due to weakness and fall with near syncope though it was not clear.  Per chart, recently started taking dementia medication which may have caused this episode.   Clinical Impression   Pt admitted with above. Pt currently with functional limitations due to the deficits listed below (see OT Problem List). PTA patient's sister was living with him and providing care PRN and she will be caring for him when he goes home as well as other local family to assist.  Anticipate that patient ready to go home with with family when he is cleared medically.  No further Acute OT needs at this time.    OT Assessment  All further OT needs can be met in the next venue of care;Patient does not need any further OT services    Follow Up Recommendations  Supervision/Assistance - 24 hour    Equipment Recommendations  Tub/shower seat    Precautions / Restrictions Precautions Precautions: Fall Restrictions Weight Bearing Restrictions: No   Pertinent Vitals/Pain Denies pain    ADL  Grooming: Performed;Wash/dry hands;Supervision/safety Where Assessed - Grooming: Unsupported standing Upper Body Bathing: Simulated;Set up;Supervision/safety Where Assessed - Upper Body Bathing: Unsupported sitting;Supported sit to stand Lower Body Bathing: Simulated;Set up;Supervision/safety Where Assessed - Lower Body Bathing: Unsupported sitting;Supported standing;Supported sit to stand Upper Body Dressing: Simulated;Set up;Supervision/safety Where Assessed - Upper Body Dressing: Unsupported sitting Lower Body Dressing: Performed;Simulated;Supervision/safety;Set up Toilet Transfer:  Performed;Supervision/safety Toilet Transfer Method: Sit to stand;Stand pivot Toilet Transfer Equipment: Grab bars;Regular height toilet Toileting - Clothing Manipulation and Hygiene: Simulated;Supervision/safety Where Assessed - Engineer, mining and Hygiene: Sit to stand from 3-in-1 or toilet Transfers/Ambulation Related to ADLs: Patient easily distracted therefore, LOB one time yet did not require assist to recover. ADL Comments: PTA, patient's sister assisted PRN and is willing and able to assist again with other family assisting as well.    OT Diagnosis: Cognitive deficits  OT Problem List: Decreased cognition;Decreased safety awareness;Decreased knowledge of use of DME or AE   OT Goals(Current goals can be found in the care plan section) Acute Rehab OT Goals Patient Stated Goal: ready to go home  Visit Information  Last OT Received On: 03/10/13 Assistance Needed: +1 History of Present Illness: [name] is an 79 yo with PMH of dementia and coronary artery disease who was admitted due to weakness and fall with near syncope though it was not clear.  Per chart, recently started taking dementia medication which may have caused this episode.       Prior Functioning     Home Living Family/patient expects to be discharged to:: Private residence Living Arrangements: Other relatives (sister, Lucendia Herrlich has been living with patient ~6weeks to assist with his care.) Available Help at Discharge: Family;Friend(s);Available 24 hours/day Type of Home: House Additional Comments: Patient reports grab bars in bathroom, patient's sister/caregiver is unsure. Prior Function Level of Independence: Needs assistance Gait / Transfers Assistance Needed: Mod I-supervision ADL's / Homemaking Assistance Needed: sister performs IADLs and will assist patient with b/dsg PRN Comments: Easily laughs and makes jokes therefore unsure of full picture Communication Communication: No  difficulties Dominant Hand: Right    Cognition  Cognition Arousal/Alertness: Awake/alert Behavior During Therapy: WFL for tasks assessed/performed;Impulsive Overall  Cognitive Status: History of cognitive impairments - at baseline ("I live with my wife",  Wife is no longer living per 4 family members in room)    Extremity/Trunk Assessment Upper Extremity Assessment Upper Extremity Assessment: Overall WFL for tasks assessed Lower Extremity Assessment Lower Extremity Assessment: Defer to PT evaluation     Mobility Bed Mobility Bed Mobility: Rolling Right;Right Sidelying to Sit;Sit to Sidelying Right;Scooting to [facility];Supine to Sit Rolling Right: 6: Modified independent (Device/Increase time) Right Sidelying to Sit: 6: Modified independent (Device/Increase time) Supine to Sit: 6: Modified independent (Device/Increase time) Sit to Sidelying Right: 6: Modified independent (Device/Increase time) Scooting to [facility]: 6: Modified independent (Device/Increase time) Transfers Transfers: Sit to Stand;Stand to Sit Sit to Stand: 5: Supervision Stand to Sit: 5: Supervision Details for Transfer Assistance: Easily distracted, joking and laughing with family resulted in need for supervision.       End of Session OT - End of Session Activity Tolerance: Patient tolerated treatment well Patient left: in bed;with call bell/phone within reach;with family/visitor present  GO Functional Assessment Tool Used: Clinical Judgement Functional Limitation: Self care Self Care Current Status (W0981): At least 1 percent but less than 20 percent impaired, limited or restricted Self Care Goal Status (X9147): At least 1 percent but less than 20 percent impaired, limited or restricted Self Care Discharge Status [phone]): At least 1 percent but less than 20 percent impaired, limited or restricted   [name] 03/10/2013, 4:52 PM

## 2013-03-10 NOTE — Progress Notes (Signed)
INITIAL NUTRITION ASSESSMENT  DOCUMENTATION CODES Per approved criteria  -Not Applicable   INTERVENTION:  Ensure Complete PO TID, each supplement provides 350 kcal and 13 grams of protein.  NUTRITION DIAGNOSIS: Inadequate oral intake related to poor appetite as evidenced by 6% weight loss in the past month.   Goal: Intake to meet >90% of estimated nutrition needs.  Monitor:  PO intake, labs, weight trend.  Reason for Assessment: MST=3  79 y.o. male  Admitting Dx: Syncope; fell out of chair  ASSESSMENT: Patient admitted on 10/26 after falling over from the chair while eating. Patient with recent poor intake for the past 2 weeks with 6% weight loss in the past month.  Patient not in room at this time; he's out of his room for vascular ultrasound and ECG. Preliminary results are bilateral ICA stenosis. Patient's family in room waiting on patient; spoke with his sister, Lucendia Herrlich, who lives with him. She reports that the patient drinks one Boost supplement every day; has been eating poorly for the past few weeks and has lost 6% of usual weight in the past 2 weeks. He sees a physician every 3 weeks for B12 shots. Unable to complete nutrition focused physical exam at this time.  Height: Ht Readings from Last 1 Encounters:  03/09/13 5\' 6"  (1.676 m)    Weight: Wt Readings from Last 1 Encounters:  03/09/13 132 lb 3.2 oz (59.966 kg)    Ideal Body Weight: 64.5 kg  % Ideal Body Weight: 93%  Wt Readings from Last 10 Encounters:  03/09/13 132 lb 3.2 oz (59.966 kg)  02/19/13 140 lb (63.504 kg)  02/11/13 141 lb (63.957 kg)  11/29/11 147 lb 4.8 oz (66.815 kg)  11/29/11 147 lb 4.8 oz (66.815 kg)  05/25/11 141 lb (63.957 kg)  03/21/11 146 lb 3.2 oz (66.316 kg)  03/15/11 149 lb 14.6 oz (68 kg)  03/02/11 147 lb (66.679 kg)  01/19/11 145 lb (65.772 kg)    Usual Body Weight: 141 lb  % Usual Body Weight: 94%  BMI:  Body mass index is 21.35 kg/(m^2).  Estimated Nutritional  Needs: Kcal: 1500-1700 Protein: 75-90 gm Fluid: 1.5-1.7 L  Skin: no wounds  Diet Order: Cardiac  EDUCATION NEEDS: -Education not appropriate at this time   Intake/Output Summary (Last 24 hours) at 03/10/13 1217 Last data filed at 03/10/13 0900  Gross per 24 hour  Intake    360 ml  Output      0 ml  Net    360 ml    Last BM: 10/27   Labs:   Recent Labs Lab 03/09/13 1825 03/10/13 0535  NA 138 136  K 5.2* 4.0  CL 101 101  CO2 26 25  BUN 20 17  CREATININE 1.13 1.08  CALCIUM 10.0 8.9  MG  --  1.9  PHOS  --  3.6  GLUCOSE 129* 105*    CBG (last 3)  No results found for this basename: GLUCAP,  in the last 72 hours  Scheduled Meds: . aspirin EC  81 mg Oral Daily  . docusate sodium  100 mg Oral BID  . donepezil  5 mg Oral QHS  . enoxaparin (LOVENOX) injection  40 mg Subcutaneous Q24H  . isosorbide dinitrate  10 mg Oral BID  . metoprolol tartrate  25 mg Oral BID  . sodium chloride  3 mL Intravenous Q12H    Continuous Infusions:   Past Medical History  Diagnosis Date  . Hyperkalemia     with ACE inhibitor  .  Hypertension     controlled with Amlodipine  . Hyperlipidemia   . History of nephrolithiasis   . Myocardial infarction     July 1988/ Anterior MI with CABG in 1989  . Abnormal nuclear cardiac imaging test 2010    with apical akinesia with prior infarct. No ischemia  . History of heart attack   . GERD (gastroesophageal reflux disease)   . Coronary artery disease     Remote CABG in 1988 with last cath in 2006, last stress test in 2010;  LHC (7/13):  pRCA occluded, LM stent into the pLAD occluded, pOM1 occluded, pOM2 occluded, CFX 40%, S-PDA ok, S-OM1/OM2 ok, L-LAD atretic.  PCI:  POBA to the stent in the LM extending into the pLAD  . Prostate cancer     treated with radiation in 2000  . History of bladder cancer     Past Surgical History  Procedure Laterality Date  . Cystoscopy w/ ureteral stent placement  03/11/08  . Appendectomy    .  Lithotripsy      right sided  . Coronary artery bypass graft      01-05-88  . Cardiac catheterization  03/07/05  . Coronary angioplasty  01/20/03    left main stent     Joaquin Courts, RD, LDN, CNSC Pager [phone] After Hours Pager [phone]

## 2013-03-11 LAB — URINALYSIS, ROUTINE W REFLEX MICROSCOPIC
Bilirubin Urine: NEGATIVE
Glucose, UA: NEGATIVE mg/dL
Ketones, ur: NEGATIVE mg/dL
Leukocytes, UA: NEGATIVE
Nitrite: NEGATIVE
Protein, ur: NEGATIVE mg/dL
pH: 5.5 (ref 5.0–8.0)

## 2013-03-11 NOTE — Discharge Summary (Signed)
[facility]  Physician Discharge Summary   Patient ID: [name] MRN: [id number] DOB/AGE: October 26, 1923 79 y.o.  Admit date: 03/09/2013 Discharge date: 03/11/2013  PCP: Aida Puffer, MD  DISCHARGE DIAGNOSES:  Active Problems:   Coronary artery disease   Hypertension   Syncope   RECOMMENDATIONS FOR OUTPATIENT FOLLOW UP: 1. Needs close follow up with PCP or cardiologist 2. Aricept has been stopped for now as it was started recently and can cause syncopal episodes.  DISCHARGE CONDITION: fair  Diet recommendation: Heart healthy  Filed Weights   03/09/13 2228  Weight: 59.966 kg (132 lb 3.2 oz)    INITIAL HISTORY: [name] presented with a fall and possible near syncope though it was not clear. Of note patient had had a Holter and event monitor in September which was only significant for occasional bradycardia. He was started on Aricept recently.  Consultations:  None  Procedures: 2D ECHO 10/27 Study Conclusions - Procedure narrative: Transthoracic echocardiography. The study was technically difficult, as a result of poor acoustic windows and poor sound wave transmission. - Left ventricle: Wall thickness was increased in a pattern of mild LVH. Systolic function was moderately to severely reduced. The estimated ejection fraction was in the range of 30% to 35%. Probable severe hypokinesis of the mid-distalanteroseptal and apical myocardium. - Mitral valve: Calcified annulus.  Carotid Doppler 10/27 Summary: - The left vertebral artery appears patent with antegrade flow. - The right vertebral artery appears patent with atypical waveform, demonstrating loss of diastolic component. This may indicate distal obstruction. - Findings consistent with1- 39 percent stenosis involving the right internal carotid artery and the left internal carotid artery.  HOSPITAL COURSE:   Fall/Near Syncope  Etiology remains unclear. He is now asymptomatic. Orthostatics were  checked and BP did not change with position but HR did climb slightly. Troponins are normal. EKG shows no new changes. It appears that he was started on Aricept recently by his neurologist. Aricept can cause syncopal episodes. Will stop it for now. He was seen by OT and no further follow up was necessary.   Hypertension  BP remained stable. Continue BB.  Coronary artery disease/Cardiomyopathy Recent PTCA to LM and LAD by [name] in July. Remains stable. ECHO findings as above. He has a known history of cardiomyopathy. He has ruled out for ACS. He should follow up with his cardiologist. He has experienced orthostatic hypotension on ACEI and this was stopped in past by his cardiologist. This can be readdressed by them in follow up.  Dementia  Vascular plus degenerative. Aricept stopped for now as it could have contributed to presentation. Can follow up with neurology for alternative agents or can be rechallenged again with Aricept.   Patient remained asymptomatic. He was stable. He can be discharged.  PERTINENT LABS:  The results of significant diagnostics from this hospitalization (including imaging, microbiology, ancillary and laboratory) are listed below for reference.     Labs: Basic Metabolic Panel:  Recent Labs Lab 03/09/13 1825 03/10/13 0535  NA 138 136  K 5.2* 4.0  CL 101 101  CO2 26 25  GLUCOSE 129* 105*  BUN 20 17  CREATININE 1.13 1.08  CALCIUM 10.0 8.9  MG  --  1.9  PHOS  --  3.6   Liver Function Tests:  Recent Labs Lab 03/09/13 1825 03/10/13 0535  AST 26 20  ALT 14 11  ALKPHOS 123* 102  BILITOT 0.6 0.7  PROT 7.1 6.2  ALBUMIN 3.4* 3.0*   CBC:  Recent  Labs Lab 03/09/13 1825 03/10/13 0535  WBC 11.6* 8.0  NEUTROABS 9.5*  --   HGB 16.1 14.0  HCT 47.2 41.4  MCV 91.8 92.8  PLT 261 234   Cardiac Enzymes:  Recent Labs Lab 03/10/13 0535 03/10/13 1115 03/10/13 1708  TROPONINI <0.30 <0.30 <0.30   BNP: BNP (last 3 results)  Recent Labs   02/02/13 1316  PROBNP 1422.0*    IMAGING STUDIES Dg Chest 2 View  03/09/2013   CLINICAL DATA:  Weakness, hypertension.  EXAM: CHEST  2 VIEW  COMPARISON:  02/02/2013  FINDINGS: Previous CABG. Heart size normal. Tortuous atheromatous aorta. 13 mm right suprahilar nodule (previously 16 mm). Smaller right upper lobe nodule seen previously is no longer conspicuous. No new nodule or infiltrate. No effusion.  IMPRESSION: Slight decrease in size of right suprahilar nodule. No new lesion identified.   Electronically Signed   By: Oley Balm M.D.   On: 03/09/2013 18:48    DISCHARGE EXAMINATION: Filed Vitals:   03/10/13 1009 03/10/13 1404 03/10/13 2024 03/11/13 0446  BP: 123/75 130/69 130/84 145/72  Pulse: 104 108 91 72  Temp:  98.3 F (36.8 C) 98.9 F (37.2 C) 98.1 F (36.7 C)  TempSrc:   Oral Oral  Resp:   16 16  Height:      Weight:      SpO2: 96% 97% 100% 99%   General appearance: alert, cooperative, appears stated age and no distress Resp: clear to auscultation bilaterally Cardio: regular rate and rhythm, S1, S2 normal, no murmur, click, rub or gallop Neurologic: Alert and oriented X 3, normal strength and tone. Normal symmetric reflexes. Normal coordination and gait  DISPOSITION: Home with girlfriend  Discharge Orders   Future Appointments Provider Department Dept Phone   04/01/2013 3:15 PM Laurey Morale, MD [facility] [place] Office [phone]   08/20/2013 11:00 AM Omelia Blackwater, DO [facility] [phone]   Future Orders Complete By Expires   Diet - low sodium heart healthy  As directed    Discharge instructions  As directed    Comments:     We are asking you to stop taking Aricept as it was started recently and it can cause passing out episodes. Please talk to your Neurologist for further input. Please follow up with [name] or with your PCP by next week.   Increase activity slowly  As directed       ALLERGIES: No Known  Allergies  Current Discharge Medication List    CONTINUE these medications which have NOT CHANGED   Details  aspirin 81 MG EC tablet Take 81 mg by mouth daily.     beta carotene w/minerals (OCUVITE) tablet Take 1 tablet by mouth daily.    isosorbide dinitrate (ISORDIL) 10 MG tablet Take 10 mg by mouth 2 (two) times daily.    metoprolol tartrate (LOPRESSOR) 25 MG tablet Take 25 mg by mouth 2 (two) times daily.    nitroGLYCERIN (NITROSTAT) 0.4 MG SL tablet Place 0.4 mg under the tongue every 5 (five) minutes as needed for chest pain.      STOP taking these medications     donepezil (ARICEPT) 5 MG tablet        Follow-up Information   Follow up with LITTLE,JAMES, MD. Schedule an appointment as soon as possible for a visit in 1 week.   Specialty:  Family Medicine   Contact information:   [address] [place] [address] [phone]  Follow up with Pamella Pert, MD. Schedule an appointment as soon as possible for a visit in 1 week.   Specialty:  Cardiology   Contact information:   [address] [place] [address] [phone]       TOTAL DISCHARGE TIME: 35 mins  [facility]  [facility] Pager [phone]  03/11/2013, 11:08 AM

## 2013-03-11 NOTE — Progress Notes (Signed)
Discharge instructions given to sister.  Verbalized understanding, no questions asked.  Left via wheelchair with sister. Rubye Oaks

## 2013-03-18 ENCOUNTER — Ambulatory Visit (INDEPENDENT_AMBULATORY_CARE_PROVIDER_SITE_OTHER): Payer: Medicare Other | Admitting: Physician Assistant

## 2013-03-18 ENCOUNTER — Encounter: Payer: Self-pay | Admitting: Physician Assistant

## 2013-03-18 ENCOUNTER — Encounter (INDEPENDENT_AMBULATORY_CARE_PROVIDER_SITE_OTHER): Payer: Self-pay

## 2013-03-18 VITALS — BP 129/74 | HR 85 | Ht 66.0 in | Wt 137.1 lb

## 2013-03-18 DIAGNOSIS — R55 Syncope and collapse: Secondary | ICD-10-CM

## 2013-03-18 DIAGNOSIS — I10 Essential (primary) hypertension: Secondary | ICD-10-CM

## 2013-03-18 DIAGNOSIS — I5022 Chronic systolic (congestive) heart failure: Secondary | ICD-10-CM

## 2013-03-18 DIAGNOSIS — I2510 Atherosclerotic heart disease of native coronary artery without angina pectoris: Secondary | ICD-10-CM

## 2013-03-18 DIAGNOSIS — E785 Hyperlipidemia, unspecified: Secondary | ICD-10-CM

## 2013-03-18 DIAGNOSIS — I2589 Other forms of chronic ischemic heart disease: Secondary | ICD-10-CM

## 2013-03-18 MED ORDER — PRAVASTATIN SODIUM 20 MG PO TABS: 20.0000 mg | ORAL_TABLET | Freq: Every day | ORAL | Status: DC

## 2013-03-18 NOTE — Patient Instructions (Signed)
START PRAVASTATIN 20 MG EVERY NIGHT  PLEASE SCHEDULE TO HAVE FASTING LIPID AND LIVER PANEL DONE IN 6 WEEKS  PLEASE SCHEDULE TO HAVE AN EVENT MONITOR DX HTN, CAD, SYNCOPE  MAKE SURE TO KEEP TO YOUR APPT WITH [name] 04/01/13

## 2013-03-18 NOTE — Progress Notes (Signed)
[address] [place], [place]  [address] Phone: [phone] Fax:  [phone]  Date:  03/18/2013   ID:  [name], DOB 27-Mar-1924, MRN [id number]  PCP:  Aida Puffer, MD  Cardiologist:  Dr. Yates Decamp => Dr. Marca Ancona     History of Present Illness: [name] is a 79 y.o. male who returns for follow up after a recent admission to the hospital.   He is a prior patient of [name] and was to start seeing [name] after Dr. [name] retirement. He was seen in this office in 01/2011 by [name], NP.  He then started seeing [name].   He has a hx of CAD, s/p CABG, s/p stent to the LM into the pLAD, ICM with EF 30%, systolic CHF, HTN, HL, Lung nodules, thyroid nodule. When last seen by [name], NP he complained of near syncope. A holter monitor was done that was notable for sinus brady only. He established with [name] some time after this and ultimately underwent LHC (7/13): pRCA occluded, LM stent into the pLAD occluded, pOM1 occluded, pOM2 occluded, CFX 40%, S-PDA ok, S-OM1/OM2 ok, L-LAD atretic. PCI: POBA to the stent in the LM extending into the pLAD.   He had followed with [name] fairly closely and was seen in the ED 9/22 for reported palpitations. He followed up with [name].   He was seen in this office 02/11/13 largely for unclear reasons.  A 3rd party had arranged an appointment here after disagreeing with recommendations by [name].  However, the patient and his HCPOA seemed to have desired to remain with [name].  I spoke to [name] by phone at that visit. He told me that the patient had "spells" for years that are assoc with confusion or possible syncope. [name] had pursued event monitors often without significant arrhythmias noted. The patient had classic angina that resolved after his PCI last year. The patient had worsening memory and it was recommended he be seen by neurology. The patient was mildly orthostatic at Dr. [name]  office and his ACE inhibitor was stopped.   When last seen here, the patient was given the option of following wit Dr. Marca Ancona or [name].  He was to make a decision prior to follow up.  Since then he has seen neurology.  He was placed on Aricept for dementia.  He was then admitted to [facility]10[facility]28 with a fall and questionable near syncope.  Echo (03/10/13):  Mild LVH, EF 30-35%, probable severe AS and apical HK.  Carotid US (03/10/13):  1-39% bilateral ICA.  His Aricept was stopped.  Orthostatics were unremarkable aside from slight HR increase with standing.  Of note, his d/c summary requested he follow up with [name].  But he prefers to follow here now.  He is here with his niece and his sister again today.  They provide most of the history.  His sister is not certain that he passed out.  He fell off a chair some time after eating.  He looked pale and diaphoretic.  She asked him if he was ok and he responded to her.  There was no reported seizure activity.  He has not had a recurrence.  There has been no chest pain, dyspnea, orthopnea, PND, edema.    I received his records from Dr. [name] office.  Echo in 10/12: EF 30-35%.  Myoview 10/12:  Inf-septal scar, anterior scar, mild peri-infarct ischemia,  EF 33%.  Event monitor 9/12: no significant arrhythmias.  Of note, he saw Dr. Sherryl Manges prior to the event monitor.    Recent Labs: 03/10/2013: ALT 11; Creatinine 1.08; Hemoglobin 14.0; Potassium 4.0; TSH 2.243  02/02/2013: Pro B Natriuretic peptide (BNP) 1422.0*   Wt Readings from Last 3 Encounters:  03/18/13 137 lb 1.9 oz (62.197 kg)  03/09/13 132 lb 3.2 oz (59.966 kg)  02/19/13 140 lb (63.504 kg)     Past Medical History  Diagnosis Date  . Hyperkalemia     with ACE inhibitor  . Hypertension     controlled with Amlodipine  . Hyperlipidemia   . History of nephrolithiasis   . Myocardial infarction     July 1988/ Anterior MI with CABG in 1989  . Abnormal nuclear cardiac  imaging test 2010    with apical akinesia with prior infarct. No ischemia  . History of heart attack   . GERD (gastroesophageal reflux disease)   . Coronary artery disease     Remote CABG in 1988 with last cath in 2006, last stress test in 2010;  LHC (7/13):  pRCA occluded, LM stent into the pLAD occluded, pOM1 occluded, pOM2 occluded, CFX 40%, S-PDA ok, S-OM1/OM2 ok, L-LAD atretic.  PCI:  POBA to the stent in the LM extending into the pLAD  . Prostate cancer     treated with radiation in 2000  . History of bladder cancer     Current Outpatient Prescriptions  Medication Sig Dispense Refill  . aspirin 81 MG EC tablet Take 81 mg by mouth daily.       . beta carotene w/minerals (OCUVITE) tablet Take 1 tablet by mouth daily.      . isosorbide dinitrate (ISORDIL) 10 MG tablet Take 10 mg by mouth 2 (two) times daily.      . metoprolol tartrate (LOPRESSOR) 25 MG tablet Take 25 mg by mouth 2 (two) times daily.      . nitroGLYCERIN (NITROSTAT) 0.4 MG SL tablet Place 0.4 mg under the tongue every 5 (five) minutes as needed for chest pain.       No current facility-administered medications for this visit.    Allergies:   Review of patient's allergies indicates no known allergies.   Social History:  The patient  reports that he quit smoking about 25 years ago. His smoking use included Cigarettes. He has a 44 pack-year smoking history. He has never used smokeless tobacco. He reports that he does not drink alcohol or use illicit drugs.   Family History:  The patient's family history includes Asthma in his brother and sister; Coronary artery disease in his sister; Emphysema in his brother; Heart attack in his mother; Heart disease in his father.   ROS:  Please see the history of present illness.      All other systems reviewed and negative.   PHYSICAL EXAM: VS:  BP 129/74  Pulse 85  Ht 5\' 6"  (1.676 m)  Wt 137 lb 1.9 oz (62.197 kg)  BMI 22.14 kg/m2 Well nourished, well developed, in no acute  distress HEENT: normal Neck: no JVD Cardiac:  normal S1, S2; RRR; no murmur Lungs:  clear to auscultation bilaterally, no wheezing, rhonchi or rales Abd: soft, nontender, no hepatomegaly Ext: no edema Skin: warm and dry Neuro:  CNs 2-12 intact, no focal abnormalities noted  EKG:  NSR, HR 85, LAD, inf Q waves, ant-lat Q waves, NSSTTW changes     ASSESSMENT AND PLAN:  1. Syncope:  It  is not clear he had a syncopal episode.  Question if he had a bradycardic event mediated by the Aricept.  He has an ICM and has an anterior and inferoseptal scar.  He is at risk for VTach, but hx does not really suggest this.  His records indicate he has demonstrated signs of carotid hypersensitivity on exam in the past.  After evaluation by Dr. Graciela Husbands in 2012 he had a monitor that was unrevealing.  He had these same spells prior to his PCI last year and after.  I reviewed with [name] when he was last here and he noted the patient had classic angina prior to his PCI that resolved.  He has had no recent chest pain.  I will arrange an event monitor to rule out arrhythmia.   2. CAD:  No angina.  Continue ASA.  Resume statin.  I will start Pravastatin 20 mg QHS. 3. Ischemic Cardiomyopathy:  He was taken off of ACEI previously due to orthostatic hypotension.  Continue with current dose of beta blocker.  Consider resuming ACEI at some point in the future. 4. Chronic Systolic CHF:  Volume stable.  5. Hypertension:  Controlled.  6. Memory Loss:  Stay off Aricept.  I have asked that he arrange sooner follow up with neurology.   7. Hyperlipidemia:  Start Pravastatin 20 QHS.  Check Lipids and LFTs in 6 weeks.   8. Disposition:  F/u with Dr. Marca Ancona 04/01/13 as planned.   Signed, Tereso Newcomer, PA-C  03/18/2013 3:15 PM

## 2013-03-25 ENCOUNTER — Encounter: Payer: Self-pay | Admitting: *Deleted

## 2013-03-25 DIAGNOSIS — I2510 Atherosclerotic heart disease of native coronary artery without angina pectoris: Secondary | ICD-10-CM

## 2013-03-25 DIAGNOSIS — I10 Essential (primary) hypertension: Secondary | ICD-10-CM

## 2013-03-25 DIAGNOSIS — R55 Syncope and collapse: Secondary | ICD-10-CM

## 2013-03-25 NOTE — Progress Notes (Signed)
Patient ID: JAELYN CLONINGER, male   DOB: Jul 01, 1923, 79 y.o.   MRN: [id number] [employer] 30 day cardiac event monitor applied to patient.  Unable to send baseline tracing to [employer] due to poor cell tower coverage.  [employer] called regarding the problem.  They are to call the patient at home [phone] within 2-3 hours in order to attempt to transmit a baseline reading.

## 2013-04-01 ENCOUNTER — Encounter: Payer: Self-pay | Admitting: Cardiology

## 2013-04-01 ENCOUNTER — Ambulatory Visit (INDEPENDENT_AMBULATORY_CARE_PROVIDER_SITE_OTHER): Payer: Medicare Other | Admitting: Cardiology

## 2013-04-01 VITALS — BP 122/76 | HR 92 | Ht 68.0 in | Wt 137.0 lb

## 2013-04-01 DIAGNOSIS — R55 Syncope and collapse: Secondary | ICD-10-CM

## 2013-04-01 DIAGNOSIS — I2510 Atherosclerotic heart disease of native coronary artery without angina pectoris: Secondary | ICD-10-CM

## 2013-04-01 DIAGNOSIS — I2589 Other forms of chronic ischemic heart disease: Secondary | ICD-10-CM

## 2013-04-01 DIAGNOSIS — E785 Hyperlipidemia, unspecified: Secondary | ICD-10-CM

## 2013-04-01 DIAGNOSIS — I255 Ischemic cardiomyopathy: Secondary | ICD-10-CM

## 2013-04-01 NOTE — Patient Instructions (Signed)
Your physician recommends that you schedule a follow-up appointment in: 3 months with Dr Shirlee Latch.  Your physician recommends that you return for a FASTING lipid profile: in 3 months when you see Dr Shirlee Latch.

## 2013-04-02 DIAGNOSIS — I255 Ischemic cardiomyopathy: Secondary | ICD-10-CM | POA: Insufficient documentation

## 2013-04-02 NOTE — Progress Notes (Signed)
Patient ID: [name], male   DOB: 07-12-1923, 79 y.o.   MRN: [id number] PCP: Dr. Clarene Duke  79 yo with history of dementia, CAD s/p CABG, ischemic cardiomyopathy, and "spells" of weakness (?presyncope) presents for cardiology followup.  I am seeing him for the first time today and reviewed all the old records.  The "spells" seem to be presyncope though he has difficult describing them.  He has had them for years.  Event monitoring has not shown arrhythmia.  He has had orthostatic hypotension and ACEI was stopped.  Most recently, he was admitted in 10/14 with a fall, ?presyncope (was not observed and he does not remember details).  Aricept was stopped with the thought that he could have had a bradycardic event.  He was set up for an event monitor but was unable to wear it (unable to manage given dementia).  He has had no presyncope since he was in the hospital.  No chest pain.  In terms of activity, his family thinks he is doing reasonably well.  He is not very active but is above to walk around his house and around stores without exertional dyspnea. No orthopnea. Weight is stable.   Labs (10/14): K 4, creatinine 1.08  PMH: 1. B12 deficiency 2. HTN 3. Hyperlipidemia 4. Lung nodules 5. CAD: CABG 1989, later had stent from LM into pLAD.  LHC (7/13) with pRCA totally occluded, LM stent totally occluded, prox OM1 occluded, prox OM2 occluded, SVG-PDA patent, SVG-OM1/OM2 patent, LIMA-LAD atretic.  Patient had POBA LM stent.  6. History of "spells" : ?Syncope.  He has had event monitors, most recently in 9/12, that have showed no arrhythmias.   7. History of orthostatic hypotension: Stopped ACEI.  8. Dementia: Alzheimers-type 9. Chronic systolic CHF: Echo (10/14) with EF 30-35%, severe hypokinesis of the anteroseptal wall and apex, mild LVH.  10. Carotid dopplers (10/14) with 1-39% bilateral ICA stenosis.    SH: Lives in [place] with sister.  Nonsmoker.   FH: No premature CAD.   ROS: All systems  reviewed and negative except as per HPI.   Current Outpatient Prescriptions  Medication Sig Dispense Refill  . aspirin 81 MG EC tablet Take 81 mg by mouth daily.       . beta carotene w/minerals (OCUVITE) tablet Take 1 tablet by mouth daily.      . isosorbide dinitrate (ISORDIL) 10 MG tablet Take 10 mg by mouth 2 (two) times daily.      . metoprolol tartrate (LOPRESSOR) 25 MG tablet Take 25 mg by mouth 2 (two) times daily.      . nitroGLYCERIN (NITROSTAT) 0.4 MG SL tablet Place 0.4 mg under the tongue every 5 (five) minutes as needed for chest pain.      . pravastatin (PRAVACHOL) 20 MG tablet Take 1 tablet (20 mg total) by mouth daily.  30 tablet  11   No current facility-administered medications for this visit.   BP 122/76  Pulse 92  Ht 5\' 8"  (1.727 m)  Wt 137 lb (62.143 kg)  BMI 20.84 kg/m2  SpO2 94% General: NAD Neck: No JVD, no thyromegaly or thyroid nodule.  Lungs: Clear to auscultation bilaterally with normal respiratory effort. CV: Nondisplaced PMI.  Heart regular S1/S2, no S3/S4, no murmur.  No peripheral edema.  No carotid bruit.  Normal pedal pulses.  Abdomen: Soft, nontender, no hepatosplenomegaly, no distention.  Skin: Intact without lesions or rashes.  Neurologic: Alert and oriented x 3.  Psych: Normal affect. Extremities: No clubbing or  cyanosis.   Assessment/Plan: 1. ?Presyncope: Patient has had a long history of "spells" that may be presyncope.  Very difficult to get history given dementia.  These events do not seem to be arrhythmic given evaluations in the past by event monitors.  Family does not think he can manage an event monitor again, and I do not think there will be a lot of utility in repeating it.  He does have a history of orthostatic hypotension but this seems better off ACEI.   2. CAD: Stable, no chest pain.  Continue ASA 81, statin. He is no longer on Plavix.  3. Ischemic cardiomyopathy: He appears euvolemic on exam.  Given orthostatic hypotension history,  I am not going to adjust his meds any today.   Followup in 3 months with lipids.   Marca Ancona 04/02/2013 4:26 PM

## 2013-04-28 ENCOUNTER — Other Ambulatory Visit: Payer: Medicare Other

## 2013-04-29 ENCOUNTER — Ambulatory Visit (INDEPENDENT_AMBULATORY_CARE_PROVIDER_SITE_OTHER): Payer: Medicare Other

## 2013-04-29 DIAGNOSIS — E785 Hyperlipidemia, unspecified: Secondary | ICD-10-CM

## 2013-04-29 DIAGNOSIS — I2510 Atherosclerotic heart disease of native coronary artery without angina pectoris: Secondary | ICD-10-CM

## 2013-04-29 LAB — LIPID PANEL: Triglycerides: 202 mg/dL — ABNORMAL HIGH (ref 0.0–149.0)

## 2013-04-29 LAB — HEPATIC FUNCTION PANEL
ALT: 16 U/L (ref 0–53)
AST: 26 U/L (ref 0–37)
Alkaline Phosphatase: 79 U/L (ref 39–117)
Bilirubin, Direct: 0.1 mg/dL (ref 0.0–0.3)
Total Protein: 6.9 g/dL (ref 6.0–8.3)

## 2013-04-30 ENCOUNTER — Telehealth: Payer: Self-pay | Admitting: *Deleted

## 2013-04-30 LAB — LDL CHOLESTEROL, DIRECT: Direct LDL: 130.8 mg/dL

## 2013-04-30 NOTE — Telephone Encounter (Signed)
s/w pt's neice Bonita Quin who helps take care of pt; has been notified about lab results. She does state that pt is taking pravastatin daily. I advised I will d/w Bing Neighbors. PA to see if he wants to make any changes, Bonita Quin said ok and thank you

## 2013-05-01 NOTE — Progress Notes (Signed)
Quick Note:  LMTCB 12/18@2 :55pm/pe ______

## 2013-05-02 ENCOUNTER — Telehealth: Payer: Self-pay | Admitting: *Deleted

## 2013-05-02 DIAGNOSIS — I2510 Atherosclerotic heart disease of native coronary artery without angina pectoris: Secondary | ICD-10-CM

## 2013-05-02 DIAGNOSIS — E785 Hyperlipidemia, unspecified: Secondary | ICD-10-CM

## 2013-05-02 MED ORDER — PRAVASTATIN SODIUM 80 MG PO TABS: 80.0000 mg | ORAL_TABLET | Freq: Every day | ORAL | Status: DC

## 2013-05-02 NOTE — Telephone Encounter (Signed)
Niece advised to increase pravastatin per Bing Neighbors. PA to 80 mg qhs, FLP/LFT 3 months.

## 2013-05-06 ENCOUNTER — Telehealth: Payer: Self-pay | Admitting: *Deleted

## 2013-05-06 DIAGNOSIS — I2510 Atherosclerotic heart disease of native coronary artery without angina pectoris: Secondary | ICD-10-CM

## 2013-05-06 DIAGNOSIS — E785 Hyperlipidemia, unspecified: Secondary | ICD-10-CM

## 2013-05-06 MED ORDER — PRAVASTATIN SODIUM 80 MG PO TABS: 80.0000 mg | ORAL_TABLET | Freq: Every day | ORAL | Status: DC

## 2013-05-06 NOTE — Telephone Encounter (Signed)
pt's neice Bonita Quin has been made aware of increase in pravastatin to 80 mg ; new rx sent in; Pt has appt 2/18 w/[name] and will get FLP/LFT then which will be about 8 weeks, I said this should be fine but let [name] make that decision, she said ok.

## 2013-06-23 ENCOUNTER — Emergency Department (HOSPITAL_COMMUNITY)
Admission: EM | Admit: 2013-06-23 | Discharge: 2013-06-23 | Disposition: A | Discharge status: Home / Self Care | Payer: Medicare Other | Attending: Emergency Medicine | Admitting: Emergency Medicine

## 2013-06-23 ENCOUNTER — Emergency Department (HOSPITAL_COMMUNITY): Payer: Medicare Other

## 2013-06-23 ENCOUNTER — Encounter (HOSPITAL_COMMUNITY): Payer: Self-pay | Admitting: Emergency Medicine

## 2013-06-23 DIAGNOSIS — I252 Old myocardial infarction: Secondary | ICD-10-CM | POA: Insufficient documentation

## 2013-06-23 DIAGNOSIS — Z7982 Long term (current) use of aspirin: Secondary | ICD-10-CM | POA: Insufficient documentation

## 2013-06-23 DIAGNOSIS — Z8551 Personal history of malignant neoplasm of bladder: Secondary | ICD-10-CM | POA: Insufficient documentation

## 2013-06-23 DIAGNOSIS — R55 Syncope and collapse: Secondary | ICD-10-CM

## 2013-06-23 DIAGNOSIS — E785 Hyperlipidemia, unspecified: Secondary | ICD-10-CM | POA: Insufficient documentation

## 2013-06-23 DIAGNOSIS — Z87442 Personal history of urinary calculi: Secondary | ICD-10-CM | POA: Insufficient documentation

## 2013-06-23 DIAGNOSIS — I10 Essential (primary) hypertension: Secondary | ICD-10-CM | POA: Insufficient documentation

## 2013-06-23 DIAGNOSIS — I2510 Atherosclerotic heart disease of native coronary artery without angina pectoris: Secondary | ICD-10-CM | POA: Insufficient documentation

## 2013-06-23 DIAGNOSIS — Z8546 Personal history of malignant neoplasm of prostate: Secondary | ICD-10-CM | POA: Insufficient documentation

## 2013-06-23 DIAGNOSIS — Z79899 Other long term (current) drug therapy: Secondary | ICD-10-CM | POA: Insufficient documentation

## 2013-06-23 DIAGNOSIS — Z8719 Personal history of other diseases of the digestive system: Secondary | ICD-10-CM | POA: Insufficient documentation

## 2013-06-23 DIAGNOSIS — Z87891 Personal history of nicotine dependence: Secondary | ICD-10-CM | POA: Insufficient documentation

## 2013-06-23 DIAGNOSIS — Z951 Presence of aortocoronary bypass graft: Secondary | ICD-10-CM | POA: Insufficient documentation

## 2013-06-23 DIAGNOSIS — Z9889 Other specified postprocedural states: Secondary | ICD-10-CM | POA: Insufficient documentation

## 2013-06-23 LAB — BASIC METABOLIC PANEL
BUN: 18 mg/dL (ref 6–23)
CO2: 26 meq/L (ref 19–32)
CREATININE: 1.3 mg/dL (ref 0.50–1.35)
Calcium: 9.5 mg/dL (ref 8.4–10.5)
Chloride: 102 mEq/L (ref 96–112)
GFR calc non Af Amer: 48 mL/min — ABNORMAL LOW (ref >90)
GFR, EST AFRICAN AMERICAN: 55 mL/min — AB (ref >90)
Glucose, Bld: 116 mg/dL — ABNORMAL HIGH (ref 70–99)
POTASSIUM: 4.9 meq/L (ref 3.7–5.3)
SODIUM: 141 meq/L (ref 137–147)

## 2013-06-23 LAB — CBC WITH DIFFERENTIAL/PLATELET
BASOS PCT: 1 % (ref 0–1)
Basophils Absolute: 0.1 10*3/uL (ref 0.0–0.1)
Eosinophils Absolute: 0.2 10*3/uL (ref 0.0–0.7)
Eosinophils Relative: 2 % (ref 0–5)
HEMATOCRIT: 45.2 % (ref 39.0–52.0)
Hemoglobin: 14.8 g/dL (ref 13.0–17.0)
Lymphocytes Relative: 14 % (ref 12–46)
Lymphs Abs: 1.3 10*3/uL (ref 0.7–4.0)
MCH: 30.3 pg (ref 26.0–34.0)
MCHC: 32.7 g/dL (ref 30.0–36.0)
MCV: 92.6 fL (ref 78.0–100.0)
Monocytes Absolute: 0.6 10*3/uL (ref 0.1–1.0)
Monocytes Relative: 7 % (ref 3–12)
NEUTROS PCT: 77 % (ref 43–77)
Neutro Abs: 7.2 10*3/uL (ref 1.7–7.7)
PLATELETS: 226 10*3/uL (ref 150–400)
RBC: 4.88 MIL/uL (ref 4.22–5.81)
RDW: 13.9 % (ref 11.5–15.5)
WBC: 9.4 10*3/uL (ref 4.0–10.5)

## 2013-06-23 LAB — URINE MICROSCOPIC-ADD ON

## 2013-06-23 LAB — URINALYSIS, ROUTINE W REFLEX MICROSCOPIC
BILIRUBIN URINE: NEGATIVE
Glucose, UA: NEGATIVE mg/dL
Ketones, ur: NEGATIVE mg/dL
LEUKOCYTES UA: NEGATIVE
NITRITE: NEGATIVE
PH: 6.5 (ref 5.0–8.0)
Protein, ur: NEGATIVE mg/dL
SPECIFIC GRAVITY, URINE: 1.021 (ref 1.005–1.030)
UROBILINOGEN UA: 0.2 mg/dL (ref 0.0–1.0)

## 2013-06-23 LAB — PROTIME-INR
INR: 0.91 (ref 0.00–1.49)
Prothrombin Time: 12.1 seconds (ref 11.6–15.2)

## 2013-06-23 LAB — GLUCOSE, CAPILLARY: Glucose-Capillary: 100 mg/dL — ABNORMAL HIGH (ref 70–99)

## 2013-06-23 LAB — APTT: aPTT: 25 seconds (ref 24–37)

## 2013-06-23 LAB — TROPONIN I

## 2013-06-23 NOTE — ED Notes (Signed)
Dr. Karle Starch at bedside speaking with family

## 2013-06-23 NOTE — ED Notes (Signed)
Patient was able to urinate into a urinal, in and out cath not necessary

## 2013-06-23 NOTE — ED Notes (Addendum)
Per EMS:  Sitting at kitchen table eating brunch, family states that pt suddenly fell asleep/syncopal episode, did not actually fall. Assisted to floor/recover position, regained consciousness, pale diaphoretic, family states that they also witness full body shaking, patient was incontinence, which is not unusual.  Has extensive cardiac hx with stents, had episode of vomiting after he passed out.  12 lead unremarkable, NSR with PVC's, HR 75 regular.  CBG 119 PTA.  Alert & oriented x 2 which is not abnormal due to hx of dementia.    BP 129/81 20 Left forearm

## 2013-06-23 NOTE — ED Provider Notes (Signed)
Patient spoke with social work but he is not interested in home health. He does have family who stays with him and helps him. They could have someone come in the home but he is refusing that at this time. Also shortness in the room she felt that while he was talking he desatted in the 80s however no one else witnessed this and it is unclear if the pulse ox was picking up indicates the patient does rub his fingers together with talking and there is not a clear connection at times with his pulse ox monitor. We will ambulate the patient and ensure he is not becoming hypoxic. However patient denies chest pain or shortness of breath.  All labs and imaging wnl today  5:24 PM Pt ambulated in the hallway wihtout difficulty and stayed at 95-96% on RA.  Will d/c home.  Blanchie Dessert, MD 06/23/13 1724

## 2013-06-23 NOTE — ED Provider Notes (Signed)
CSN: [id number]     Arrival date & time 06/23/13  1306 History   First MD Initiated Contact with Patient 06/23/13 1317     Chief Complaint  Patient presents with  . Loss of Consciousness    or seizure/sycnope?     (Consider location/radiation/quality/duration/timing/severity/associated sxs/prior Treatment) Patient is a 80 y.o. male presenting with syncope.  Loss of Consciousness  Level 5 caveat due to dementia Pt brought from home via EMS who report family called for ?seizure vs syncope episode. He was reportedly eating lunch when he began to 'foam at the mouth' and then lost consciousness. He was helped to the floor and then had brief myoclonus. He is back to baseline now. Denies complaint. Does not remember the episode.   Past Medical History  Diagnosis Date  . Hyperkalemia     with ACE inhibitor  . Hypertension     controlled with Amlodipine  . Hyperlipidemia   . History of nephrolithiasis   . Myocardial infarction     July 1988/ Anterior MI with CABG in 1989  . Abnormal nuclear cardiac imaging test 2010    with apical akinesia with prior infarct. No ischemia  . History of heart attack   . GERD (gastroesophageal reflux disease)   . Coronary artery disease     Remote CABG in 1988 with last cath in 2006, last stress test in 2010;  LHC (7/13):  pRCA occluded, LM stent into the pLAD occluded, pOM1 occluded, pOM2 occluded, CFX 40%, S-PDA ok, S-OM1/OM2 ok, L-LAD atretic.  PCI:  POBA to the stent in the LM extending into the pLAD  . Prostate cancer     treated with radiation in 2000  . History of bladder cancer    Past Surgical History  Procedure Laterality Date  . Cystoscopy w/ ureteral stent placement  03/11/08  . Appendectomy    . Lithotripsy      right sided  . Coronary artery bypass graft      01-05-88  . Cardiac catheterization  03/07/05  . Coronary angioplasty  01/20/03    left main stent    Family History  Problem Relation Age of Onset  . Heart attack Mother   .  Heart disease Father   . Coronary artery disease Sister   . Asthma Brother   . Asthma Sister   . Emphysema Brother    History  Substance Use Topics  . Smoking status: Former Smoker -- 0.80 packs/day for 55 years    Types: Cigarettes    Quit date: 05/16/1987  . Smokeless tobacco: Never Used  . Alcohol Use: No    Review of Systems  Cardiovascular: Positive for syncope.    Unable to assess due to mental status.    Allergies  Review of patient's allergies indicates no known allergies.  Home Medications   Current Outpatient Rx  Name  Route  Sig  Dispense  Refill  . aspirin 81 MG EC tablet   Oral   Take 81 mg by mouth daily.          . beta carotene w/minerals (OCUVITE) tablet   Oral   Take 1 tablet by mouth daily.         . isosorbide dinitrate (ISORDIL) 10 MG tablet   Oral   Take 10 mg by mouth daily.          . metoprolol tartrate (LOPRESSOR) 25 MG tablet   Oral   Take 25 mg by mouth 2 (two) times daily.         Marland Kitchen  nitroGLYCERIN (NITROSTAT) 0.4 MG SL tablet   Sublingual   Place 0.4 mg under the tongue every 5 (five) minutes as needed for chest pain.         . pravastatin (PRAVACHOL) 80 MG tablet   Oral   Take 1 tablet (80 mg total) by mouth daily.   30 tablet   11    BP 139/75  Pulse 71  Temp(Src) 97.3 F (36.3 C) (Oral)  Resp 16  Ht 5\' 7"  (1.702 m)  Wt 150 lb (68.04 kg)  BMI 23.49 kg/m2  SpO2 95% Physical Exam  Nursing note and vitals reviewed. Constitutional: He appears well-developed and well-nourished.  HENT:  Head: Normocephalic and atraumatic.  Eyes: EOM are normal. Pupils are equal, round, and reactive to light.  Neck: Normal range of motion. Neck supple.  Cardiovascular: Normal rate, normal heart sounds and intact distal pulses.   Pulmonary/Chest: Effort normal and breath sounds normal.  Abdominal: Bowel sounds are normal. He exhibits no distension. There is no tenderness.  Musculoskeletal: Normal range of motion. He exhibits no  edema and no tenderness.  Neurological: He is alert. He has normal strength. No cranial nerve deficit or sensory deficit.  Skin: Skin is warm and dry. No rash noted.  Psychiatric: He has a normal mood and affect.    ED Course  Procedures (including critical care time) Labs Review Labs Reviewed  BASIC METABOLIC PANEL - Abnormal; Notable for the following:    Glucose, Bld 116 (*)    GFR calc non Af Amer 48 (*)    GFR calc Af Amer 55 (*)    All other components within normal limits  GLUCOSE, CAPILLARY - Abnormal; Notable for the following:    Glucose-Capillary 100 (*)    All other components within normal limits  CBC WITH DIFFERENTIAL  TROPONIN I  PROTIME-INR  APTT  URINALYSIS, ROUTINE W REFLEX MICROSCOPIC   Imaging Review Dg Chest 2 View  06/23/2013   CLINICAL DATA:  Syncope, weakness  EXAM: CHEST  2 VIEW  COMPARISON:  DG CHEST 2 VIEW dated 03/09/2013; CT CHEST W/O CM dated 01/16/2012; CT CHEST W/O CM dated 05/02/2011; DG CHEST 2 VIEW dated 03/21/2011; DG CHEST 1V PORT dated 03/16/2011; DG CHEST 1V PORT dated 03/17/2011  FINDINGS: There is a 18 mm right suprahilar nodule which is enlarged compared with 03/09/2013. There is ill-defined right infrahilar airspace opacity unchanged from the prior exams. There is no focal consolidation, pneumothorax or pleural effusion. The heart and mediastinal contours are unremarkable. Prior CABG.  Mild thoracic spine spondylosis.  IMPRESSION: No active cardiopulmonary disease.  There is an 18 mm right suprahilar pulmonary nodule enlarged compared to 03/09/2013.   Electronically Signed   By: Kathreen Devoid   On: 06/23/2013 14:10   EKG interpretation available in MUSE. Not crossing into chart at this time.   MDM   Final diagnoses:  Transient loss of consciousness   Per sister at bedside he has had similar episodes 'for a long time' but not clear if those prior episodes including LOC and myoclonus. Pt's niece is also a caregiver and is apparently enroute.    3:58 PM Niece at bedside now states patient had brief episode of 'unresponsive' while eating lunch, had shaking for 2-3 seconds the drooling for a few seconds. Family is concerned about him falling at home and their inability to get him up. Would like to discuss home health vs SNF placement. Social Work to evaluate. Care signed out to Dr. Maryan Rued at the  change of shift.    Kayveon Lennartz B. Karle Starch, MD 06/23/13 1601

## 2013-06-23 NOTE — Progress Notes (Addendum)
ED CM consulted by Dr. Karle Starch to meet with patient regarding recommendation for possible [facility] services. Pt presents to C ED with a Transient loss of consciousness episode. While meeting with patient and family O2 sats dropped down to 80's. Greeg RN notified adjusted the finger probe and placed 2LO2 via [place]. O2 Sats returned to 98%. Dr. Maryan Rued notified of the episodes.  Pt lives at home with 24 hour family caregivers. Discussed recommendations and explained the services offered by [facility]. Pt declines services at this time. ED CM provided name and number to contact me if he should have any further questions or concerns that may arise late today if discharged home. Pt and family verbalized understanding teach back method used. No further ED CM needs identified

## 2013-06-23 NOTE — ED Notes (Signed)
Ambulated patient on room air with SPO2 monitoring. While walking all the way around Module A his SPO2 level remained between 95% and 96%.

## 2013-07-02 ENCOUNTER — Ambulatory Visit: Payer: Medicare Other | Admitting: Cardiology

## 2013-07-02 ENCOUNTER — Other Ambulatory Visit: Payer: Medicare Other

## 2013-07-10 ENCOUNTER — Ambulatory Visit: Payer: Medicare Other | Admitting: Cardiology

## 2013-08-04 ENCOUNTER — Emergency Department (HOSPITAL_COMMUNITY): Payer: Medicare Other

## 2013-08-04 ENCOUNTER — Encounter (HOSPITAL_COMMUNITY): Payer: Self-pay | Admitting: Emergency Medicine

## 2013-08-04 ENCOUNTER — Inpatient Hospital Stay (HOSPITAL_COMMUNITY)
Admission: EM | Admit: 2013-08-04 | Discharge: 2013-08-08 | DRG: 204 | Disposition: A | Discharge status: Skilled Nursing Facility | Payer: Medicare Other | Attending: Internal Medicine | Admitting: Internal Medicine

## 2013-08-04 DIAGNOSIS — Z7982 Long term (current) use of aspirin: Secondary | ICD-10-CM

## 2013-08-04 DIAGNOSIS — E860 Dehydration: Secondary | ICD-10-CM | POA: Diagnosis present

## 2013-08-04 DIAGNOSIS — R222 Localized swelling, mass and lump, trunk: Secondary | ICD-10-CM

## 2013-08-04 DIAGNOSIS — Z8249 Family history of ischemic heart disease and other diseases of the circulatory system: Secondary | ICD-10-CM

## 2013-08-04 DIAGNOSIS — R627 Adult failure to thrive: Secondary | ICD-10-CM | POA: Diagnosis present

## 2013-08-04 DIAGNOSIS — I255 Ischemic cardiomyopathy: Secondary | ICD-10-CM

## 2013-08-04 DIAGNOSIS — F0390 Unspecified dementia without behavioral disturbance: Secondary | ICD-10-CM

## 2013-08-04 DIAGNOSIS — Z951 Presence of aortocoronary bypass graft: Secondary | ICD-10-CM

## 2013-08-04 DIAGNOSIS — C7800 Secondary malignant neoplasm of unspecified lung: Secondary | ICD-10-CM | POA: Diagnosis present

## 2013-08-04 DIAGNOSIS — I252 Old myocardial infarction: Secondary | ICD-10-CM

## 2013-08-04 DIAGNOSIS — E785 Hyperlipidemia, unspecified: Secondary | ICD-10-CM

## 2013-08-04 DIAGNOSIS — R55 Syncope and collapse: Secondary | ICD-10-CM

## 2013-08-04 DIAGNOSIS — C801 Malignant (primary) neoplasm, unspecified: Secondary | ICD-10-CM | POA: Diagnosis present

## 2013-08-04 DIAGNOSIS — K219 Gastro-esophageal reflux disease without esophagitis: Secondary | ICD-10-CM | POA: Diagnosis present

## 2013-08-04 DIAGNOSIS — Z515 Encounter for palliative care: Secondary | ICD-10-CM

## 2013-08-04 DIAGNOSIS — R002 Palpitations: Secondary | ICD-10-CM

## 2013-08-04 DIAGNOSIS — Z87891 Personal history of nicotine dependence: Secondary | ICD-10-CM

## 2013-08-04 DIAGNOSIS — IMO0002 Reserved for concepts with insufficient information to code with codable children: Secondary | ICD-10-CM

## 2013-08-04 DIAGNOSIS — Z87442 Personal history of urinary calculi: Secondary | ICD-10-CM

## 2013-08-04 DIAGNOSIS — Z8546 Personal history of malignant neoplasm of prostate: Secondary | ICD-10-CM

## 2013-08-04 DIAGNOSIS — I2589 Other forms of chronic ischemic heart disease: Secondary | ICD-10-CM | POA: Diagnosis present

## 2013-08-04 DIAGNOSIS — Z66 Do not resuscitate: Secondary | ICD-10-CM | POA: Diagnosis present

## 2013-08-04 DIAGNOSIS — Z9861 Coronary angioplasty status: Secondary | ICD-10-CM

## 2013-08-04 DIAGNOSIS — R918 Other nonspecific abnormal finding of lung field: Secondary | ICD-10-CM

## 2013-08-04 DIAGNOSIS — J4489 Other specified chronic obstructive pulmonary disease: Secondary | ICD-10-CM | POA: Diagnosis present

## 2013-08-04 DIAGNOSIS — Z9181 History of falling: Secondary | ICD-10-CM

## 2013-08-04 DIAGNOSIS — J8410 Pulmonary fibrosis, unspecified: Secondary | ICD-10-CM

## 2013-08-04 DIAGNOSIS — Z8551 Personal history of malignant neoplasm of bladder: Secondary | ICD-10-CM

## 2013-08-04 DIAGNOSIS — D72829 Elevated white blood cell count, unspecified: Secondary | ICD-10-CM | POA: Diagnosis present

## 2013-08-04 DIAGNOSIS — I5022 Chronic systolic (congestive) heart failure: Secondary | ICD-10-CM | POA: Diagnosis present

## 2013-08-04 DIAGNOSIS — R4189 Other symptoms and signs involving cognitive functions and awareness: Secondary | ICD-10-CM

## 2013-08-04 DIAGNOSIS — I10 Essential (primary) hypertension: Secondary | ICD-10-CM

## 2013-08-04 DIAGNOSIS — I509 Heart failure, unspecified: Secondary | ICD-10-CM | POA: Diagnosis present

## 2013-08-04 DIAGNOSIS — J449 Chronic obstructive pulmonary disease, unspecified: Secondary | ICD-10-CM

## 2013-08-04 DIAGNOSIS — R0902 Hypoxemia: Secondary | ICD-10-CM

## 2013-08-04 DIAGNOSIS — I2510 Atherosclerotic heart disease of native coronary artery without angina pectoris: Secondary | ICD-10-CM

## 2013-08-04 DIAGNOSIS — R64 Cachexia: Secondary | ICD-10-CM | POA: Diagnosis present

## 2013-08-04 LAB — CBC WITH DIFFERENTIAL/PLATELET
Basophils Absolute: 0 10*3/uL (ref 0.0–0.1)
Basophils Relative: 0 % (ref 0–1)
EOS PCT: 1 % (ref 0–5)
Eosinophils Absolute: 0.1 10*3/uL (ref 0.0–0.7)
HCT: 47.4 % (ref 39.0–52.0)
HEMOGLOBIN: 16.5 g/dL (ref 13.0–17.0)
LYMPHS PCT: 12 % (ref 12–46)
Lymphs Abs: 1.6 10*3/uL (ref 0.7–4.0)
MCH: 32.2 pg (ref 26.0–34.0)
MCHC: 34.8 g/dL (ref 30.0–36.0)
MCV: 92.6 fL (ref 78.0–100.0)
Monocytes Absolute: 1.4 10*3/uL — ABNORMAL HIGH (ref 0.1–1.0)
Monocytes Relative: 10 % (ref 3–12)
Neutro Abs: 10.5 10*3/uL — ABNORMAL HIGH (ref 1.7–7.7)
Neutrophils Relative %: 77 % (ref 43–77)
PLATELETS: 241 10*3/uL (ref 150–400)
RBC: 5.12 MIL/uL (ref 4.22–5.81)
RDW: 13.9 % (ref 11.5–15.5)
WBC: 13.6 10*3/uL — ABNORMAL HIGH (ref 4.0–10.5)

## 2013-08-04 LAB — COMPREHENSIVE METABOLIC PANEL
AST: 15 U/L (ref 0–37)
Albumin: 3.2 g/dL — ABNORMAL LOW (ref 3.5–5.2)
Alkaline Phosphatase: 88 U/L (ref 39–117)
BILIRUBIN TOTAL: 1.6 mg/dL — AB (ref 0.3–1.2)
BUN: 15 mg/dL (ref 6–23)
CO2: 24 meq/L (ref 19–32)
Calcium: 9.8 mg/dL (ref 8.4–10.5)
Chloride: 99 mEq/L (ref 96–112)
Creatinine, Ser: 1.16 mg/dL (ref 0.50–1.35)
GFR calc Af Amer: 63 mL/min — ABNORMAL LOW (ref >90)
GFR, EST NON AFRICAN AMERICAN: 55 mL/min — AB (ref >90)
GLUCOSE: 112 mg/dL — AB (ref 70–99)
POTASSIUM: 4.1 meq/L (ref 3.7–5.3)
SODIUM: 137 meq/L (ref 137–147)
Total Protein: 7.1 g/dL (ref 6.0–8.3)

## 2013-08-04 LAB — URINALYSIS, ROUTINE W REFLEX MICROSCOPIC
Bilirubin Urine: NEGATIVE
Glucose, UA: NEGATIVE mg/dL
KETONES UR: NEGATIVE mg/dL
Leukocytes, UA: NEGATIVE
Nitrite: NEGATIVE
Protein, ur: NEGATIVE mg/dL
Specific Gravity, Urine: 1.023 (ref 1.005–1.030)
Urobilinogen, UA: 0.2 mg/dL (ref 0.0–1.0)
pH: 6 (ref 5.0–8.0)

## 2013-08-04 LAB — D-DIMER, QUANTITATIVE (NOT AT ARMC): D DIMER QUANT: 1.62 ug{FEU}/mL — AB (ref 0.00–0.48)

## 2013-08-04 LAB — URINE MICROSCOPIC-ADD ON

## 2013-08-04 LAB — I-STAT CG4 LACTIC ACID, ED: Lactic Acid, Venous: 1.98 mmol/L (ref 0.5–2.2)

## 2013-08-04 LAB — I-STAT TROPONIN, ED: TROPONIN I, POC: 0 ng/mL (ref 0.00–0.08)

## 2013-08-04 LAB — PRO B NATRIURETIC PEPTIDE: Pro B Natriuretic peptide (BNP): 904.6 pg/mL — ABNORMAL HIGH (ref 0–450)

## 2013-08-04 LAB — LIPASE, BLOOD: Lipase: 27 U/L (ref 11–59)

## 2013-08-04 MED ORDER — IOHEXOL 350 MG/ML SOLN
80.0000 mL | Freq: Once | INTRAVENOUS | Status: AC | PRN
  Administered 2013-08-04: 64 mL via INTRAVENOUS

## 2013-08-04 MED ORDER — SODIUM CHLORIDE 0.9 % IJ SOLN
3.0000 mL | Freq: Two times a day (BID) | INTRAMUSCULAR | Status: DC
  Administered 2013-08-04: 3 mL via INTRAVENOUS

## 2013-08-04 MED ORDER — SODIUM CHLORIDE 0.9 % IV BOLUS (SEPSIS)
1000.0000 mL | Freq: Once | INTRAVENOUS | Status: AC
  Administered 2013-08-04: 1000 mL via INTRAVENOUS

## 2013-08-04 MED ORDER — SODIUM CHLORIDE 0.9 % IV SOLN
INTRAVENOUS | Status: DC
  Administered 2013-08-04: 23:00:00 via INTRAVENOUS

## 2013-08-04 MED ORDER — HEPARIN SODIUM (PORCINE) 5000 UNIT/ML IJ SOLN
5000.0000 [IU] | Freq: Three times a day (TID) | INTRAMUSCULAR | Status: DC
  Administered 2013-08-04 – 2013-08-08 (×10): 5000 [IU] via SUBCUTANEOUS
  Filled 2013-08-04 (×13): qty 1

## 2013-08-04 NOTE — ED Notes (Signed)
Patient transported to CT 

## 2013-08-04 NOTE — ED Notes (Addendum)
Patients oxygen levels dropped into the 70-80's with a perfect pleth. Lung sounds changed from arrival to having crackles in his bases with slight diminished in the left lung. EDP notified and patient placed on 4L [place]. IV fluids stopped.

## 2013-08-04 NOTE — ED Notes (Signed)
Pt sats dropping into 70's and 80's -- O2 placed at 2l/[place]/m--

## 2013-08-04 NOTE — H&P (Signed)
Hospitalist Admission History and Physical  Patient name: DEMARI Gill Medical record number: [id number] Date of birth: 1924/03/05 Age: 80 y.o. Gender: male  Primary Care Provider: Tamsen Roers, MD  Chief Complaint: failure to thrive  History of Present Illness:This is a 80 y.o. year old male with multiple medical problems including dementia, COPD, recurrent presyncope, ischemic cardiomyopathy, hx/o prostate and bladder cancer presenting with failure. Pt currently lives at home and has multiple family members she will just take care of him in addition to having a private home health nurse come in 6 hours a day. Per the niece, patient has had progressive decline over the past several months with multiple episodes of falls, nonspecific presyncopal spells, confusion, decreased by mouth intake. Patient's have formal evaluation by cardiology for presyncopal spells. Was unable to tolerate a Holter monitor, the last admission did show some questionable intermittent bradycardia. Patient is unable to perform any ADLs at home independently. The patient's primary caregiver who is his sister recently broke her arm and has had worsening difficulty taking care of the patient. Per the niece, patient has been evaluated multiple times in the ER for similar symptoms with otherwise negative workup. Per the niece, patient was not swallowing his pills today which was a reason for patient to the hospital.  Initial workup in the ER was otherwise within normal limits apart from a elevated white blood cell count of 13.6. Chest x-ray did not show any pulmonary infiltrate but did show a 1.8 cm right upper lobe nodule. Urinalysis negative for any signs of infection. Patient was ambulated in the ER with noted desats into the mid to upper 80s. A CTA was obtained to rule out PE. CTA showed a right upper lobe mass compatible with neoplasm as well as a mass along the posterior upper right major fissure also concerning for neoplasm. Head  and C-spine CT also obtained that was negative for any acute intracranial abnormalities but did show degenerative changes as well as a 1 cm nodule within the thyroid gland. The family in the past has been resistant to nursing home or hospice placement. However they feel that they have exhausted their ability to care for the patient.  Patient Active Problem List   Diagnosis Date Noted  . Failure to thrive 08/04/2013  . Cardiomyopathy, ischemic 04/02/2013  . Cognitive decline 02/19/2013  . Palpitations 02/02/2013  . COPD (chronic obstructive pulmonary disease) 03/02/2011  . History of nephrolithiasis   . Granulomatous lung disease 01/25/2011  . Syncope 01/19/2011  . Coronary artery disease   . Hypertension   . Hyperlipidemia    Past Medical History: Past Medical History  Diagnosis Date  . Hyperkalemia     with ACE inhibitor  . Hypertension     controlled with Amlodipine  . Hyperlipidemia   . History of nephrolithiasis   . Myocardial infarction     July 1988/ Anterior MI with CABG in 1989  . Abnormal nuclear cardiac imaging test 2010    with apical akinesia with prior infarct. No ischemia  . History of heart attack   . GERD (gastroesophageal reflux disease)   . Coronary artery disease     Remote CABG in 1988 with last cath in 2006, last stress test in 2010;  LHC (7/13):  pRCA occluded, LM stent into the pLAD occluded, pOM1 occluded, pOM2 occluded, CFX 40%, S-PDA ok, S-OM1/OM2 ok, L-LAD atretic.  PCI:  POBA to the stent in the LM extending into the pLAD  . Prostate cancer  treated with radiation in 2000  . History of bladder cancer     Past Surgical History: Past Surgical History  Procedure Laterality Date  . Cystoscopy w/ ureteral stent placement  03/11/08  . Appendectomy    . Lithotripsy      right sided  . Coronary artery bypass graft      01-05-88  . Cardiac catheterization  03/07/05  . Coronary angioplasty  01/20/03    left main stent     Social  History: History   Social History  . Marital Status: Widowed    Spouse Name: N/A    Number of Children: o  . Years of Education: 9th   Occupational History  . RETIRED     Plumbing work   Social History Main Topics  . Smoking status: Former Smoker -- 0.80 packs/day for 55 years    Types: Cigarettes    Quit date: 05/16/1987  . Smokeless tobacco: Never Used  . Alcohol Use: No  . Drug Use: No  . Sexual Activity: Not Currently   Other Topics Concern  . None   Social History Narrative   Patient lives at home with sister [name].    Patient is retired.    Patient does not have any children.   Patient has 9th grade education.    Family History: Family History  Problem Relation Age of Onset  . Heart attack Mother   . Heart disease Father   . Coronary artery disease Sister   . Asthma Brother   . Asthma Sister   . Emphysema Brother     Allergies: No Known Allergies  Current Facility-Administered Medications  Medication Dose Route Frequency Provider Last Rate Last Dose  . heparin injection 5,000 Units  5,000 Units Subcutaneous 3 times per day Douglas Howells, MD      . sodium chloride 0.9 % injection 3 mL  3 mL Intravenous Q12H Douglas Howells, MD       Current Outpatient Prescriptions  Medication Sig Dispense Refill  . aspirin 81 MG EC tablet Take 81 mg by mouth daily.       . beta carotene w/minerals (OCUVITE) tablet Take 1 tablet by mouth daily.      . isosorbide dinitrate (ISORDIL) 10 MG tablet Take 10 mg by mouth daily.       . metoprolol tartrate (LOPRESSOR) 25 MG tablet Take 25 mg by mouth daily.       . pravastatin (PRAVACHOL) 80 MG tablet Take 80 mg by mouth at bedtime.      . nitroGLYCERIN (NITROSTAT) 0.4 MG SL tablet Place 0.4 mg under the tongue every 5 (five) minutes as needed for chest pain.       Review Of Systems: 12 point ROS negative except as noted above in HPI.  Physical Exam: Filed Vitals:   08/04/13 2215  BP: 157/76  Pulse: 72  Temp:   Resp:  16    General: underweight, mildly cachectic, mildly responsive to commands  HEENT: PERRLA and extra ocular movement intact Heart: S1, S2 normal, no murmur, rub or gallop, regular rate and rhythm Lungs: clear to auscultation, no wheezes or rales and unlabored breathing Abdomen: abdomen is soft without significant tenderness, masses, organomegaly or guarding Extremities: extremities normal, atraumatic, no cyanosis or edema Skin:no rashes, no ecchymoses Neurology: normal without focal findings, mental status, speech normal, alert and oriented x3, PERLA and reflexes normal and symmetric  Labs and Imaging: Lab Results  Component Value Date/Time   NA 137 08/04/2013  1:13 PM   K 4.1 08/04/2013  1:13 PM   CL 99 08/04/2013  1:13 PM   CO2 24 08/04/2013  1:13 PM   BUN 15 08/04/2013  1:13 PM   CREATININE 1.16 08/04/2013  1:13 PM   CREATININE 1.15 08/02/2010  5:42 PM   GLUCOSE 112* 08/04/2013  1:13 PM   Lab Results  Component Value Date   WBC 13.6* 08/04/2013   HGB 16.5 08/04/2013   HCT 47.4 08/04/2013   MCV 92.6 08/04/2013   PLT 241 08/04/2013   Urinalysis    Component Value Date/Time   COLORURINE YELLOW 08/04/2013 1618   APPEARANCEUR CLEAR 08/04/2013 1618   LABSPEC 1.023 08/04/2013 1618   PHURINE 6.0 08/04/2013 1618   GLUCOSEU NEGATIVE 08/04/2013 1618   HGBUR SMALL* 08/04/2013 1618   BILIRUBINUR NEGATIVE 08/04/2013 1618   KETONESUR NEGATIVE 08/04/2013 1618   PROTEINUR NEGATIVE 08/04/2013 1618   UROBILINOGEN 0.2 08/04/2013 1618   NITRITE NEGATIVE 08/04/2013 1618   LEUKOCYTESUR NEGATIVE 08/04/2013 1618       Dg Chest 2 View  08/04/2013   CLINICAL DATA:  Failure to thrive, cough, chest pain. Altered mental status.  EXAM: CHEST  2 VIEW  COMPARISON:  06/23/2013  FINDINGS: Heart is normal size. Prior median sternotomy. Left lung is clear. Right upper lobe pulmonary nodule measures approximately 1.8 cm, stable. No effusions. No acute bony abnormality.  IMPRESSION: Stable approximately 1.8 cm right upper  lobe pulmonary nodule.  No acute findings.   Electronically Signed   By: Rolm Baptise M.D.   On: 08/04/2013 14:41   Ct Angio Chest Pe W/cm &/or Wo Cm  08/04/2013   CLINICAL DATA:  Shortness of breath, hypoxia and elevated D-dimer.  EXAM: CT ANGIOGRAPHY CHEST WITH CONTRAST  TECHNIQUE: Multidetector CT imaging of the chest was performed using the standard protocol during bolus administration of intravenous contrast. Multiplanar CT image reconstructions and MIPs were obtained to evaluate the vascular anatomy.  CONTRAST:  15mL OMNIPAQUE IOHEXOL 350 MG/ML SOLN  COMPARISON:  01/16/2012 and prior chest CTs  FINDINGS: This is a technically satisfactory study.  No pulmonary emboli are identified.  There is no evidence thoracic aortic aneurysm.  Mild cardiomegaly, cardiac surgical changes and coronary artery calcifications again noted.  There are no pleural or pericardial effusions.  A 1.5 x 2 cm irregular mass within the anterior right upper lobe (image 28) is compatible with neoplasm.  A 1.1 cm nodule along the posterior superior right major fissure (image 41) this suspicious for neoplasm/ metastasis. Adjacent peripheral 1.3 cm soft tissue/mass or atelectasis noted.  No enlarged lymph nodes are identified.  A 6 mm right upper lobe nodule (image 35) is unchanged.  Mild bibasilar atelectasis/scarring noted.  Right basilar pleural calcifications are present.  The visualized upper abdomen is unremarkable.  No acute or suspicious bony abnormalities are identified.  Review of the MIP images confirms the above findings.  IMPRESSION: No evidence of pulmonary emboli or thoracic aortic aneurysm.  1.5 x 2 cm irregular anterior right upper lobe mass compatible with neoplasm/bronchogenic carcinoma.  1.1 cm nodule and adjacent 1.3 cm soft tissue along the posterior upper right major fissure suspicious for neoplasm/metastasis.  Cardiomegaly and prior cardiac surgical changes.   Electronically Signed   By: Hassan Rowan M.D.   On: 08/04/2013  17:45   Ct Cervical Spine Wo Contrast  08/04/2013   CLINICAL DATA:  Failure to thrive  EXAM: CT HEAD WITHOUT CONTRAST  CT CERVICAL SPINE WITHOUT CONTRAST  TECHNIQUE: Multidetector CT  imaging of the head and cervical spine was performed following the standard protocol without intravenous contrast. Multiplanar CT image reconstructions of the cervical spine were also generated.  COMPARISON:  Prior head CT 11/05/2011  FINDINGS: CT HEAD FINDINGS  Negative for acute intracranial hemorrhage, acute infarction, mass, mass effect, hydrocephalus or midline shift. Gray-white differentiation is preserved throughout. No significant interval change in the appearance of diffuse cerebral and cerebellar atrophy with ex vacuo ventriculomegaly of the lateral ventricles. Advanced confluent periventricular, subcortical and deep white matter hypoattenuation is most consistent with the sequelae of longstanding microvascular ischemic white matter disease. No focal soft tissue or calvarial abnormality. Left mastoid effusion. Fluid extends into the middle ear. The right mastoid air cells and visualized paranasal sinuses are well aerated. Atherosclerotic calcifications noted in the bilateral cavernous and supra clinoid internal carotid arteries.  CT CERVICAL SPINE FINDINGS  No acute fracture, malalignment or prevertebral soft tissue swelling. Multilevel degenerative disc disease most significant at C6-C7. Additionally, there is uncovertebral joint hypertrophy on the left at C4-C5 and C5-C6 resulting in a minimal foraminal narrowing. Advanced bulky degenerative changes are noted at the atlantodental interval. There is fragmentation versus dystrophic calcification in the associated transverse ligament as well as a subchondral cyst in the dens. No aggressive appearing lytic or blastic osseous lesion. Heterogeneous thyroid gland. There is a circumscribed 1.2 cm low-attenuation nodule in the mid aspect of the right gland. Atherosclerotic  vascular calcifications noted in the bilateral carotid arteries. No acute soft tissue abnormality. The lung apices are unremarkable.  IMPRESSION: CT HEAD  1. No acute intracranial abnormality. 2. Stable advanced cerebral and cerebellar atrophy A and advanced chronic microvascular ischemic white matter disease. 3. Left mastoid effusion.  Query clinical symptoms of mastoiditis? 4. Atherosclerotic calcification of the bilateral intracranial internal carotid arteries. CT C-SPINE  1. No acute fracture or malalignment. 2. Advanced bulky degenerative changes at the atlantodental interval. Differential considerations include advanced degenerative osteoarthritis, calcium pyrophosphate deposition disease, and rheumatoid arthritis. 3. Additional degenerative cervical spondylosis most notable at C6-C7. 4. Incidentally imaged 1.2 cm low-attenuation nodule in the mid right thyroid gland. 5. Carotid artery atherosclerotic vascular calcifications.   Electronically Signed   By: Jacqulynn Cadet M.D.   On: 08/04/2013 16:53     Assessment and Plan: [name] is a 80 y.o. year old male presenting with failure to thrive  Failure to thrive: Likely multifactorial with contributions of dementia, primary versus metastatic cancer, dehydration. Will gently hydrate pt.  Family will like to discuss overall plan and plan of care options for patient with the rest of the family. They are preliminarily considering hospice, but would like to further discuss this with his family. They agreeable to supportive care in the interim.   FEN/GI:  n.p.o. pending bedside swallow eval. Will likely need a modified dysphagia diet. Restart meds pending bedside swallow eval. Prophylaxis: Subcutaneous heparin Disposition:  pending further evaluation  Code Status: full code pending further discussion with family        Douglas Howells MD  Pager: [phone]

## 2013-08-04 NOTE — Progress Notes (Signed)
CSW consult to pt regarding SNF/ALF placement vs home health. CSW spoke with pt/family who shared that pt has been very weak and has had difficulty caring for himself. Pt.'s sister, Lulu Riding has moved in with pt to care for him. Pt.'s sister has a broken arm. CSW made mention of SNF/ALF placement and they were not receptive to the idea. It is the family's intention to keep family in home. Pt/family reported that they have a private agency, [facility] coming in 6 hrs per day and it has proven to be very expensive. CSW shared with family that CM, Lucia Bitter will come and speak with them regarding home health possibly using their Medicare benefit.    [address], [place]

## 2013-08-04 NOTE — ED Notes (Signed)
Transporting patient to new room assignment. 

## 2013-08-04 NOTE — ED Notes (Signed)
Results of lactic acid given to Dr. Bednar 

## 2013-08-04 NOTE — ED Notes (Signed)
Attempted to call report, receiving RN unable to come to the phone. Will call back when able to receive report.

## 2013-08-04 NOTE — ED Notes (Signed)
Patient arrived via [facility] from home. Family states patient has been failure to thrive, no engaging in conversation unless spoken too and not eating well. Patient was just here in feb with the same symptoms. VSS, Alert, oriented to self and place only. History of dementia.

## 2013-08-04 NOTE — Progress Notes (Addendum)
08/04/2013 1630 W.Stann Mainland RN BSN [phone] ED CM informed by Delia Chimes at bedside of oxygen desaturation,RN will notify EDP for possible admission.   08/04/13 1505 W. Stann Mainland RN BSN [phone] ED CM discussed with Dr. Stevie Kern EDP. Pt work-up negative. ED CM at bedside to speak with patient and family. Sister Lulu Riding points to [facility] on list and reports that patient is receiving RN/PT services. Will contact [facility] to resume services once cleared for discharge home.   08/04/2013 1356 W. Stann Mainland RN [phone] ED CM consulted to meet with patient and family regarded possible HH.  Pt presented to [facility] ED with generalized weakness,  80 yo M with dementia and lives with his elderly sister who is primary caregiver. Spoke with sister Lulu Riding concerning care goals. She states, vehemently that ALF or SNF is not an option, she wanted to keep him at home. Presently they have [facility] from [place] 7 days for 6 hours. Discussed the possibility of [facility] as plan of care. Family in agreement. Family in agreement. Choice given [facility] list provided. Discussed that patient evaluation still pending, will consult with EDP and keep family updated. Family verbalizes understanding. CM will continue to follow.

## 2013-08-04 NOTE — ED Provider Notes (Signed)
CSN: [id number]     Arrival date & time 08/04/13  1248 History   First MD Initiated Contact with Patient 08/04/13 1249     Chief Complaint  Patient presents with  . Failure To Thrive     (Consider location/radiation/quality/duration/timing/severity/associated sxs/prior Treatment) HPI 80 year old male with history of dementia lives at home with family and at baseline has generalized weakness now brought to the emergency department because apparently the patient has less oral intake and is participating lessen conversations that he usually does at his baseline. Patient himself has no complaints and has no idea why he is here. He is oriented to person place and denies headache neck pain back pain chest pain shortness breath abdominal pain vomiting diarrhea or other concerns. He denies any change in speech vision swallowing or understanding he denies any focal or lateralizing weakness numbness incoordination or vertigo. There is no treatment prior to arrival. There is no family present upon initial evaluation. Past Medical History  Diagnosis Date  . Hyperkalemia     with ACE inhibitor  . Hypertension     controlled with Amlodipine  . Hyperlipidemia   . History of nephrolithiasis   . Myocardial infarction     July 1988/ Anterior MI with CABG in 1989  . Abnormal nuclear cardiac imaging test 2010    with apical akinesia with prior infarct. No ischemia  . History of heart attack   . GERD (gastroesophageal reflux disease)   . Coronary artery disease     Remote CABG in 1988 with last cath in 2006, last stress test in 2010;  LHC (7/13):  pRCA occluded, LM stent into the pLAD occluded, pOM1 occluded, pOM2 occluded, CFX 40%, S-PDA ok, S-OM1/OM2 ok, L-LAD atretic.  PCI:  POBA to the stent in the LM extending into the pLAD  . Prostate cancer     treated with radiation in 2000  . History of bladder cancer    Past Surgical History  Procedure Laterality Date  . Cystoscopy w/ ureteral stent placement   03/11/08  . Appendectomy    . Lithotripsy      right sided  . Coronary artery bypass graft      01-05-88  . Cardiac catheterization  03/07/05  . Coronary angioplasty  01/20/03    left main stent    Family History  Problem Relation Age of Onset  . Heart attack Mother   . Heart disease Father   . Coronary artery disease Sister   . Asthma Brother   . Asthma Sister   . Emphysema Brother    History  Substance Use Topics  . Smoking status: Former Smoker -- 0.80 packs/day for 55 years    Types: Cigarettes    Quit date: 05/16/1987  . Smokeless tobacco: Never Used  . Alcohol Use: No    Review of Systems  Unable to perform ROS: Dementia      Allergies  Review of patient's allergies indicates no known allergies.  Home Medications   Current Outpatient Rx  Name  Route  Sig  Dispense  Refill  . aspirin 81 MG EC tablet   Oral   Take 81 mg by mouth daily.          . beta carotene w/minerals (OCUVITE) tablet   Oral   Take 1 tablet by mouth daily.         . isosorbide dinitrate (ISORDIL) 10 MG tablet   Oral   Take 10 mg by mouth daily.          Marland Kitchen  metoprolol tartrate (LOPRESSOR) 25 MG tablet   Oral   Take 25 mg by mouth daily.          . pravastatin (PRAVACHOL) 80 MG tablet   Oral   Take 80 mg by mouth at bedtime.         . nitroGLYCERIN (NITROSTAT) 0.4 MG SL tablet   Sublingual   Place 0.4 mg under the tongue every 5 (five) minutes as needed for chest pain.          BP 148/81  Pulse 75  Temp(Src) 98 F (36.7 C)  Resp 14  Ht 5\' 7"  (1.702 m)  Wt 147 lb (66.679 kg)  BMI 23.02 kg/m2  SpO2 100% Physical Exam  Nursing note and vitals reviewed. Constitutional:  Awake, alert, nontoxic appearance with baseline speech for patient.  HENT:  Head: Atraumatic.  Mouth/Throat: No oropharyngeal exudate.  Eyes: EOM are normal. Pupils are equal, round, and reactive to light. Right eye exhibits no discharge. Left eye exhibits no discharge.  Neck: Neck supple.   Cardiovascular: Normal rate and regular rhythm.   No murmur heard. Pulmonary/Chest: Effort normal and breath sounds normal. No stridor. No respiratory distress. He has no wheezes. He has no rales. He exhibits no tenderness.  Abdominal: Soft. Bowel sounds are normal. He exhibits no mass. There is no tenderness. There is no rebound.  Musculoskeletal: He exhibits no edema and no tenderness.  Baseline ROM, moves extremities with no obvious new focal weakness.  Lymphadenopathy:    He has no cervical adenopathy.  Neurological: He is alert.  Awake, alert, cooperative and oriented to person and place not time; motor strength 4/5 bilaterally; sensation normal to light touch bilaterally; peripheral visual fields full to confrontation; no facial asymmetry; tongue midline; major cranial nerves appear intact; no pronator drift, normal finger to nose bilaterally  Skin: No rash noted.  Psychiatric: He has a normal mood and affect.    ED Course  Procedures (including critical care time) Family arrived to talk to social work in care management and family will allow home health now but does not want assisted-living or nursing home placement for the patient, family states patient should be DO NOT RESUSCITATE and DO NOT INTUBATE, family adds that patient has had several episodes of sudden fainting and unresponsiveness of the last 2 months with multiple falls where he will suddenly become unresponsive and lose postural tone for a couple minutes at a time and this has occurred several times without prodrome over the last 2 months. While in the emergency department the patient developed asymptomatic hypoxia with room-air pulse oximetry less than 80% with reexamination of the lungs revealed bibasilar crackles unlabored respirations but hypoxic with pulse oximetry 80% room air no wheezing no retractions no rhonchi no accessory muscle usage.  Since multiple falls with syncope and dementia CTs head/C-S ordered, since  elevated d-dimer with hypoxia CTA chest ordered. Care hand-off to Dr. Kathrynn Humble with Dispo pending. [place] Reviewed  CBC WITH DIFFERENTIAL - Abnormal; Notable for the following:    WBC 13.6 (*)    Neutro Abs 10.5 (*)    Monocytes Absolute 1.4 (*)    All other components within normal limits  COMPREHENSIVE METABOLIC PANEL - Abnormal; Notable for the following:    Glucose, Bld 112 (*)    Albumin 3.2 (*)    Total Bilirubin 1.6 (*)    GFR calc non Af Amer 55 (*)    GFR calc Af Amer 63 (*)  All other components within normal limits  URINALYSIS, ROUTINE W REFLEX MICROSCOPIC - Abnormal; Notable for the following:    Hgb urine dipstick SMALL (*)    All other components within normal limits  PRO B NATRIURETIC PEPTIDE - Abnormal; Notable for the following:    Pro B Natriuretic peptide (BNP) 904.6 (*)    All other components within normal limits  D-DIMER, QUANTITATIVE - Abnormal; Notable for the following:    D-Dimer, Quant 1.62 (*)    All other components within normal limits  LIPASE, BLOOD  URINE MICROSCOPIC-ADD ON  I-STAT CG4 LACTIC ACID, ED  Randolm Idol, ED   Imaging Review Dg Chest 2 View  08/04/2013   CLINICAL DATA:  Failure to thrive, cough, chest pain. Altered mental status.  EXAM: CHEST  2 VIEW  COMPARISON:  06/23/2013  FINDINGS: Heart is normal size. Prior median sternotomy. Left lung is clear. Right upper lobe pulmonary nodule measures approximately 1.8 cm, stable. No effusions. No acute bony abnormality.  IMPRESSION: Stable approximately 1.8 cm right upper lobe pulmonary nodule.  No acute findings.   Electronically Signed   By: Rolm Baptise M.D.   On: 08/04/2013 14:41   Ct Angio Chest Pe W/cm &/or Wo Cm  08/04/2013   CLINICAL DATA:  Shortness of breath, hypoxia and elevated D-dimer.  EXAM: CT ANGIOGRAPHY CHEST WITH CONTRAST  TECHNIQUE: Multidetector CT imaging of the chest was performed using the standard protocol during bolus administration of intravenous  contrast. Multiplanar CT image reconstructions and MIPs were obtained to evaluate the vascular anatomy.  CONTRAST:  52mL OMNIPAQUE IOHEXOL 350 MG/ML SOLN  COMPARISON:  01/16/2012 and prior chest CTs  FINDINGS: This is a technically satisfactory study.  No pulmonary emboli are identified.  There is no evidence thoracic aortic aneurysm.  Mild cardiomegaly, cardiac surgical changes and coronary artery calcifications again noted.  There are no pleural or pericardial effusions.  A 1.5 x 2 cm irregular mass within the anterior right upper lobe (image 28) is compatible with neoplasm.  A 1.1 cm nodule along the posterior superior right major fissure (image 41) this suspicious for neoplasm/ metastasis. Adjacent peripheral 1.3 cm soft tissue/mass or atelectasis noted.  No enlarged lymph nodes are identified.  A 6 mm right upper lobe nodule (image 35) is unchanged.  Mild bibasilar atelectasis/scarring noted.  Right basilar pleural calcifications are present.  The visualized upper abdomen is unremarkable.  No acute or suspicious bony abnormalities are identified.  Review of the MIP images confirms the above findings.  IMPRESSION: No evidence of pulmonary emboli or thoracic aortic aneurysm.  1.5 x 2 cm irregular anterior right upper lobe mass compatible with neoplasm/bronchogenic carcinoma.  1.1 cm nodule and adjacent 1.3 cm soft tissue along the posterior upper right major fissure suspicious for neoplasm/metastasis.  Cardiomegaly and prior cardiac surgical changes.   Electronically Signed   By: Hassan Rowan M.D.   On: 08/04/2013 17:45   Ct Cervical Spine Wo Contrast  08/04/2013   CLINICAL DATA:  Failure to thrive  EXAM: CT HEAD WITHOUT CONTRAST  CT CERVICAL SPINE WITHOUT CONTRAST  TECHNIQUE: Multidetector CT imaging of the head and cervical spine was performed following the standard protocol without intravenous contrast. Multiplanar CT image reconstructions of the cervical spine were also generated.  COMPARISON:  Prior head CT  11/05/2011  FINDINGS: CT HEAD FINDINGS  Negative for acute intracranial hemorrhage, acute infarction, mass, mass effect, hydrocephalus or midline shift. Gray-white differentiation is preserved throughout. No significant interval change in the appearance of  diffuse cerebral and cerebellar atrophy with ex vacuo ventriculomegaly of the lateral ventricles. Advanced confluent periventricular, subcortical and deep white matter hypoattenuation is most consistent with the sequelae of longstanding microvascular ischemic white matter disease. No focal soft tissue or calvarial abnormality. Left mastoid effusion. Fluid extends into the middle ear. The right mastoid air cells and visualized paranasal sinuses are well aerated. Atherosclerotic calcifications noted in the bilateral cavernous and supra clinoid internal carotid arteries.  CT CERVICAL SPINE FINDINGS  No acute fracture, malalignment or prevertebral soft tissue swelling. Multilevel degenerative disc disease most significant at C6-C7. Additionally, there is uncovertebral joint hypertrophy on the left at C4-C5 and C5-C6 resulting in a minimal foraminal narrowing. Advanced bulky degenerative changes are noted at the atlantodental interval. There is fragmentation versus dystrophic calcification in the associated transverse ligament as well as a subchondral cyst in the dens. No aggressive appearing lytic or blastic osseous lesion. Heterogeneous thyroid gland. There is a circumscribed 1.2 cm low-attenuation nodule in the mid aspect of the right gland. Atherosclerotic vascular calcifications noted in the bilateral carotid arteries. No acute soft tissue abnormality. The lung apices are unremarkable.  IMPRESSION: CT HEAD  1. No acute intracranial abnormality. 2. Stable advanced cerebral and cerebellar atrophy A and advanced chronic microvascular ischemic white matter disease. 3. Left mastoid effusion.  Query clinical symptoms of mastoiditis? 4. Atherosclerotic calcification of  the bilateral intracranial internal carotid arteries. CT C-SPINE  1. No acute fracture or malalignment. 2. Advanced bulky degenerative changes at the atlantodental interval. Differential considerations include advanced degenerative osteoarthritis, calcium pyrophosphate deposition disease, and rheumatoid arthritis. 3. Additional degenerative cervical spondylosis most notable at C6-C7. 4. Incidentally imaged 1.2 cm low-attenuation nodule in the mid right thyroid gland. 5. Carotid artery atherosclerotic vascular calcifications.   Electronically Signed   By: Jacqulynn Cadet M.D.   On: 08/04/2013 16:53     EKG Interpretation   Date/Time:  Monday August 04 2013 12:55:31 EDT Ventricular Rate:  103 PR Interval:  148 QRS Duration: 97 QT Interval:  331 QTC Calculation: 433 R Axis:   -19 Text Interpretation:  Sinus tachycardia Multiple ventricular premature  complexes Inferior infarct, old Abnormal lateral Q waves Anterior infarct,  old No significant change since last tracing Confirmed by [facility]  MD, Jenny Reichmann  [phone]) on 08/04/2013 1:17:51 PM      MDM   Final diagnoses:  Hypoxia  Syncope  Dementia  Failure to thrive  Lung mass    The patient appears reasonably stabilized for admission considering the current resources, flow, and capabilities available in the ED at this time, and I doubt any other [facility] requiring further screening and/or treatment in the ED prior to admission.    Babette Relic, MD 08/04/13 [phone]

## 2013-08-04 NOTE — ED Provider Notes (Signed)
  Physical Exam  BP 147/81  Pulse 80  Temp(Src) 98 F (36.7 C)  Resp 15  Ht 5\' 7"  (1.702 m)  Wt 147 lb (66.679 kg)  BMI 23.02 kg/m2  SpO2 100%  Physical Exam  ED Course  Procedures  MDM  Pt sent to the ER with cc of failure to thrive. Assuming care from Dr. Stevie Kern at this time. Reduced po intake. Family also complains that patient is having multiple syncopal episodes and falls. Patient noted to be having desaturations in the ED whilst on room air, spontaneous.  Dr. Stevie Kern added BNP and dimer, and they are both slightly elevated. CT PE ordered, and results pending.  Will admit for hypoxia.  Filed Vitals:   08/04/13 1616  BP: 147/81  Pulse: 80  Temp:   Resp: 15     Amelianna Meller, MD 08/04/13 1629

## 2013-08-05 DIAGNOSIS — R0902 Hypoxemia: Secondary | ICD-10-CM

## 2013-08-05 DIAGNOSIS — I2589 Other forms of chronic ischemic heart disease: Secondary | ICD-10-CM

## 2013-08-05 LAB — COMPREHENSIVE METABOLIC PANEL
ALT: <5 U/L (ref 0–53)
AST: 14 U/L (ref 0–37)
Albumin: 2.8 g/dL — ABNORMAL LOW (ref 3.5–5.2)
Alkaline Phosphatase: 83 U/L (ref 39–117)
BUN: 14 mg/dL (ref 6–23)
CO2: 22 mEq/L (ref 19–32)
CREATININE: 1.05 mg/dL (ref 0.50–1.35)
Calcium: 9.5 mg/dL (ref 8.4–10.5)
Chloride: 99 mEq/L (ref 96–112)
GFR calc Af Amer: 72 mL/min — ABNORMAL LOW (ref >90)
GFR calc non Af Amer: 62 mL/min — ABNORMAL LOW (ref >90)
Glucose, Bld: 95 mg/dL (ref 70–99)
Potassium: 4.4 mEq/L (ref 3.7–5.3)
Sodium: 138 mEq/L (ref 137–147)
TOTAL PROTEIN: 6.6 g/dL (ref 6.0–8.3)
Total Bilirubin: 1.3 mg/dL — ABNORMAL HIGH (ref 0.3–1.2)

## 2013-08-05 LAB — CBC WITH DIFFERENTIAL/PLATELET
BASOS ABS: 0 10*3/uL (ref 0.0–0.1)
Basophils Relative: 0 % (ref 0–1)
Eosinophils Absolute: 0 10*3/uL (ref 0.0–0.7)
Eosinophils Relative: 0 % (ref 0–5)
HEMATOCRIT: 46.4 % (ref 39.0–52.0)
Hemoglobin: 15.6 g/dL (ref 13.0–17.0)
LYMPHS PCT: 10 % — AB (ref 12–46)
Lymphs Abs: 1.4 10*3/uL (ref 0.7–4.0)
MCH: 31.5 pg (ref 26.0–34.0)
MCHC: 33.6 g/dL (ref 30.0–36.0)
MCV: 93.5 fL (ref 78.0–100.0)
MONO ABS: 1.2 10*3/uL — AB (ref 0.1–1.0)
Monocytes Relative: 9 % (ref 3–12)
NEUTROS ABS: 11.2 10*3/uL — AB (ref 1.7–7.7)
Neutrophils Relative %: 81 % — ABNORMAL HIGH (ref 43–77)
Platelets: 208 10*3/uL (ref 150–400)
RBC: 4.96 MIL/uL (ref 4.22–5.81)
RDW: 13.8 % (ref 11.5–15.5)
WBC: 13.8 10*3/uL — AB (ref 4.0–10.5)

## 2013-08-05 LAB — TSH: TSH: 2.415 u[IU]/mL (ref 0.350–4.500)

## 2013-08-05 MED ORDER — ASPIRIN 81 MG PO CHEW
81.0000 mg | CHEWABLE_TABLET | Freq: Every day | ORAL | Status: DC
  Administered 2013-08-05 – 2013-08-08 (×4): 81 mg via ORAL
  Filled 2013-08-05 (×4): qty 1

## 2013-08-05 MED ORDER — IPRATROPIUM-ALBUTEROL 0.5-2.5 (3) MG/3ML IN SOLN: 3.0000 mL | Freq: Four times a day (QID) | RESPIRATORY_TRACT | Status: DC | PRN

## 2013-08-05 MED ORDER — ENSURE COMPLETE PO LIQD
237.0000 mL | Freq: Two times a day (BID) | ORAL | Status: DC
  Administered 2013-08-05 – 2013-08-08 (×6): 237 mL via ORAL

## 2013-08-05 MED ORDER — ISOSORBIDE DINITRATE 10 MG PO TABS
10.0000 mg | ORAL_TABLET | Freq: Every day | ORAL | Status: DC
  Administered 2013-08-05 – 2013-08-08 (×4): 10 mg via ORAL
  Filled 2013-08-05 (×4): qty 1

## 2013-08-05 MED ORDER — SIMVASTATIN 40 MG PO TABS
40.0000 mg | ORAL_TABLET | Freq: Every day | ORAL | Status: DC
  Administered 2013-08-05 – 2013-08-07 (×3): 40 mg via ORAL
  Filled 2013-08-05 (×4): qty 1

## 2013-08-05 MED ORDER — METOPROLOL TARTRATE 25 MG PO TABS
25.0000 mg | ORAL_TABLET | Freq: Every day | ORAL | Status: DC
  Administered 2013-08-05 – 2013-08-08 (×4): 25 mg via ORAL
  Filled 2013-08-05 (×4): qty 1

## 2013-08-05 NOTE — Care Management Note (Signed)
    Page 1 of 1   08/08/2013     11:58:59 AM   CARE MANAGEMENT NOTE 08/08/2013  Patient:  ADEWALE, PUCILLO   Account Number:  [id number]  Date Initiated:  08/05/2013  Documentation initiated by:  GRAVES-BIGELOW,Elynn Patteson  Subjective/Objective Assessment:   Pt admitted for syncope, SOB, and failure to thrive. Pt is form home with elderly sister. Per ED CM note pt/family not agreeable to SNF. Plan will be [facility] with [facility].     Action/Plan:   CM will continue to monitor for disposition needs.   Anticipated DC Date:  08/06/2013   Anticipated DC Plan:  [place]  CM consult      Choice offered to / List presented to:             Status of service:  Completed, signed off Medicare Important Message given?   (If response is "NO", the following Medicare IM given date fields will be blank) Date Medicare IM given:   Date Additional Medicare IM given:    Discharge Disposition:  [place]  Per UR Regulation:  Reviewed for med. necessity/level of care/duration of stay  If discussed at [place] of Stay Meetings, dates discussed:    Comments:  08-08-13 Plan for d/c to SNF today. No further needs from CM. Jacqlyn Krauss, RN,BSN  08/05/13- 1500- Marvetta Gibbons RN, BSN [phone] Received notification from [facility]- that pt active with them PTA for [facility]- RN/PT-

## 2013-08-05 NOTE — Progress Notes (Signed)
[facility] PROGRESS NOTE  [name] SWF:[id number] DOB: 1923-06-11 DOA: 08/04/2013 PCP: Tamsen Roers, MD  Assessment/Plan: 1-Failure to thrive; multifactorial contributions of dementia, primary versus metastatic cancer. Resume diet.  No evidence of infection UA with no WBC, CT no evidence of infection.  Check TSH.   2-Lung Mass: CT chest show; A 1.5 x 2 cm irregular mass within the anterior right upper lobe  is compatible with neoplasm.  A 1.1 cm nodule along the posterior superior right major fissure  this suspicious for neoplasm/ metastasis. Adjacent  peripheral 1.3 cm soft tissue/mass or atelectasis noted. Palliative care consult for goals of care.   3-COPD;will order PRN nebulizer.   4-Cardiomyopathy; resume metoprolol and Isordil.   5-Leukocytosis; no evidence of infection.   6-hx/o prostate and bladder cancer;    Code Status: Full Code.  Family Communication: care discussed with sister. She agree to meet with palliative care team.  Disposition Plan: remain inpatient.    Consultants:  Palliative care team.   Procedures: none  Antibiotics:  none  HPI/Subjective: Patient alert, oriented to place. Denies chest pain or dyspnea.   Objective: Filed Vitals:   08/05/13 1203  BP: 145/85  Pulse: 106  Temp:   Resp:     Intake/Output Summary (Last 24 hours) at 08/05/13 1310 Last data filed at 08/05/13 0855  Gross per 24 hour  Intake    805 ml  Output      0 ml  Net    805 ml   Filed Weights   08/04/13 1254  Weight: 66.679 kg (147 lb)    Exam:   General:  No distress.   Cardiovascular: S 1, S 2 RRR  Respiratory: CTA  Abdomen: Bs present, soft, NT  Musculoskeletal: trace edema.   Data Reviewed: Basic Metabolic Panel:  Recent Labs Lab 08/04/13 1313 08/05/13 0547  NA 137 138  K 4.1 4.4  CL 99 99  CO2 24 22  GLUCOSE 112* 95  BUN 15 14  CREATININE 1.16 1.05  CALCIUM 9.8 9.5   Liver Function Tests:  Recent Labs Lab  08/04/13 1313 08/05/13 0547  AST 15 14  ALT <5 <5  ALKPHOS 88 83  BILITOT 1.6* 1.3*  PROT 7.1 6.6  ALBUMIN 3.2* 2.8*    Recent Labs Lab 08/04/13 1313  LIPASE 27   No results found for this basename: AMMONIA,  in the last 168 hours CBC:  Recent Labs Lab 08/04/13 1313 08/05/13 0547  WBC 13.6* 13.8*  NEUTROABS 10.5* 11.2*  HGB 16.5 15.6  HCT 47.4 46.4  MCV 92.6 93.5  PLT 241 208   Cardiac Enzymes: No results found for this basename: CKTOTAL, CKMB, CKMBINDEX, TROPONINI,  in the last 168 hours BNP (last 3 results)  Recent Labs  02/02/13 1316 08/04/13 1554  PROBNP 1422.0* 904.6*   CBG: No results found for this basename: GLUCAP,  in the last 168 hours  No results found for this or any previous visit (from the past 240 hour(s)).   Studies: Dg Chest 2 View  08/04/2013   CLINICAL DATA:  Failure to thrive, cough, chest pain. Altered mental status.  EXAM: CHEST  2 VIEW  COMPARISON:  06/23/2013  FINDINGS: Heart is normal size. Prior median sternotomy. Left lung is clear. Right upper lobe pulmonary nodule measures approximately 1.8 cm, stable. No effusions. No acute bony abnormality.  IMPRESSION: Stable approximately 1.8 cm right upper lobe pulmonary nodule.  No acute findings.   Electronically Signed   By: Rolm Baptise M.D.  On: 08/04/2013 14:41   Ct Angio Chest Pe W/cm &/or Wo Cm  08/04/2013   CLINICAL DATA:  Shortness of breath, hypoxia and elevated D-dimer.  EXAM: CT ANGIOGRAPHY CHEST WITH CONTRAST  TECHNIQUE: Multidetector CT imaging of the chest was performed using the standard protocol during bolus administration of intravenous contrast. Multiplanar CT image reconstructions and MIPs were obtained to evaluate the vascular anatomy.  CONTRAST:  64mL OMNIPAQUE IOHEXOL 350 MG/ML SOLN  COMPARISON:  01/16/2012 and prior chest CTs  FINDINGS: This is a technically satisfactory study.  No pulmonary emboli are identified.  There is no evidence thoracic aortic aneurysm.  Mild  cardiomegaly, cardiac surgical changes and coronary artery calcifications again noted.  There are no pleural or pericardial effusions.  A 1.5 x 2 cm irregular mass within the anterior right upper lobe (image 28) is compatible with neoplasm.  A 1.1 cm nodule along the posterior superior right major fissure (image 41) this suspicious for neoplasm/ metastasis. Adjacent peripheral 1.3 cm soft tissue/mass or atelectasis noted.  No enlarged lymph nodes are identified.  A 6 mm right upper lobe nodule (image 35) is unchanged.  Mild bibasilar atelectasis/scarring noted.  Right basilar pleural calcifications are present.  The visualized upper abdomen is unremarkable.  No acute or suspicious bony abnormalities are identified.  Review of the MIP images confirms the above findings.  IMPRESSION: No evidence of pulmonary emboli or thoracic aortic aneurysm.  1.5 x 2 cm irregular anterior right upper lobe mass compatible with neoplasm/bronchogenic carcinoma.  1.1 cm nodule and adjacent 1.3 cm soft tissue along the posterior upper right major fissure suspicious for neoplasm/metastasis.  Cardiomegaly and prior cardiac surgical changes.   Electronically Signed   By: Hassan Rowan M.D.   On: 08/04/2013 17:45   Ct Cervical Spine Wo Contrast  08/04/2013   CLINICAL DATA:  Failure to thrive  EXAM: CT HEAD WITHOUT CONTRAST  CT CERVICAL SPINE WITHOUT CONTRAST  TECHNIQUE: Multidetector CT imaging of the head and cervical spine was performed following the standard protocol without intravenous contrast. Multiplanar CT image reconstructions of the cervical spine were also generated.  COMPARISON:  Prior head CT 11/05/2011  FINDINGS: CT HEAD FINDINGS  Negative for acute intracranial hemorrhage, acute infarction, mass, mass effect, hydrocephalus or midline shift. Gray-white differentiation is preserved throughout. No significant interval change in the appearance of diffuse cerebral and cerebellar atrophy with ex vacuo ventriculomegaly of the lateral  ventricles. Advanced confluent periventricular, subcortical and deep white matter hypoattenuation is most consistent with the sequelae of longstanding microvascular ischemic white matter disease. No focal soft tissue or calvarial abnormality. Left mastoid effusion. Fluid extends into the middle ear. The right mastoid air cells and visualized paranasal sinuses are well aerated. Atherosclerotic calcifications noted in the bilateral cavernous and supra clinoid internal carotid arteries.  CT CERVICAL SPINE FINDINGS  No acute fracture, malalignment or prevertebral soft tissue swelling. Multilevel degenerative disc disease most significant at C6-C7. Additionally, there is uncovertebral joint hypertrophy on the left at C4-C5 and C5-C6 resulting in a minimal foraminal narrowing. Advanced bulky degenerative changes are noted at the atlantodental interval. There is fragmentation versus dystrophic calcification in the associated transverse ligament as well as a subchondral cyst in the dens. No aggressive appearing lytic or blastic osseous lesion. Heterogeneous thyroid gland. There is a circumscribed 1.2 cm low-attenuation nodule in the mid aspect of the right gland. Atherosclerotic vascular calcifications noted in the bilateral carotid arteries. No acute soft tissue abnormality. The lung apices are unremarkable.  IMPRESSION: CT HEAD  1. No acute intracranial abnormality. 2. Stable advanced cerebral and cerebellar atrophy A and advanced chronic microvascular ischemic white matter disease. 3. Left mastoid effusion.  Query clinical symptoms of mastoiditis? 4. Atherosclerotic calcification of the bilateral intracranial internal carotid arteries. CT C-SPINE  1. No acute fracture or malalignment. 2. Advanced bulky degenerative changes at the atlantodental interval. Differential considerations include advanced degenerative osteoarthritis, calcium pyrophosphate deposition disease, and rheumatoid arthritis. 3. Additional degenerative  cervical spondylosis most notable at C6-C7. 4. Incidentally imaged 1.2 cm low-attenuation nodule in the mid right thyroid gland. 5. Carotid artery atherosclerotic vascular calcifications.   Electronically Signed   By: Jacqulynn Cadet M.D.   On: 08/04/2013 16:53    Scheduled Meds: . heparin  5,000 Units Subcutaneous 3 times per day  . sodium chloride  3 mL Intravenous Q12H   Continuous Infusions: . sodium chloride Stopped (08/05/13 0534)    Active Problems:   Syncope   Failure to thrive    Time spent: 35 minutes.     Niel Hummer A  [facility] Pager [phone]. If 7PM-7AM, please contact night-coverage at [email or web address], password [facility] 08/05/2013, 1:10 PM  LOS: 1 day

## 2013-08-05 NOTE — Progress Notes (Signed)
INITIAL NUTRITION ASSESSMENT  DOCUMENTATION CODES Per approved criteria  -Not Applicable   INTERVENTION:  Chocolate Ensure Complete PO BID, each supplement provides 350 kcal and 13 grams of protein  NUTRITION DIAGNOSIS: Increased nutrient needs related to mild-moderate depletion of muscle mass as evidenced by estimated protein needs 1.2-1.5 gm protein/kg.   Goal: Intake to meet >90% of estimated nutrition needs.  Monitor:  PO intake, labs, weight trend.  Reason for Assessment: MST  80 y.o. male  Admitting Dx: Failure to thrive; Lung Mass  ASSESSMENT: Patient is a 80 y.o. year old male with multiple medical problems including dementia, COPD, recurrent presyncope, ischemic cardiomyopathy, hx of prostate and bladder cancer presenting with failure. Pt currently lives at home and has multiple family members she will just take care of him in addition to having a private home health nurse come in 6 hours a day. Patient has had progressive decline over the past several months with multiple episodes of falls, nonspecific presyncopal spells, confusion, decreased by mouth intake. CT chest showed lung mass suspicious for neoplasm/metastasis.  Palliative Care Team has been consulted. Patients family reports that he ate well for lunch today. He likes to drink chocolate Ensure. Patient with mild-moderate depletion of muscle stores.   Nutrition Focused Physical Exam:  Subcutaneous Fat:  Orbital Region: WNL Upper Arm Region: WNL Thoracic and Lumbar Region: NA  Muscle:  Temple Region: mild depletion Clavicle Bone Region: moderate depletion Clavicle and Acromion Bone Region: mild depletion Scapular Bone Region: NA Dorsal Hand: mild depletion Patellar Region: mild depletion Anterior Thigh Region: mild depletion Posterior Calf Region: mild depletion  Edema: none   Height: Ht Readings from Last 1 Encounters:  08/04/13 5\' 7"  (1.702 m)    Weight: Wt Readings from Last 1 Encounters:   08/04/13 147 lb (66.679 kg)    Ideal Body Weight: 67.3 kg  % Ideal Body Weight: 99%  Wt Readings from Last 10 Encounters:  08/04/13 147 lb (66.679 kg)  06/23/13 150 lb (68.04 kg)  04/01/13 137 lb (62.143 kg)  03/18/13 137 lb 1.9 oz (62.197 kg)  03/09/13 132 lb 3.2 oz (59.966 kg)  02/19/13 140 lb (63.504 kg)  02/11/13 141 lb (63.957 kg)  11/29/11 147 lb 4.8 oz (66.815 kg)  11/29/11 147 lb 4.8 oz (66.815 kg)  05/25/11 141 lb (63.957 kg)    Usual Body Weight: 150 lb  % Usual Body Weight: 98%  BMI:  Body mass index is 23.02 kg/(m^2).  Estimated Nutritional Needs: Kcal: 1650-1800 Protein: 80-100 gm Fluid: 1.7-1.8 L  Skin: no wounds  Diet Order: General  EDUCATION NEEDS: -Education not appropriate at this time   Intake/Output Summary (Last 24 hours) at 08/05/13 1030 Last data filed at 08/05/13 0855  Gross per 24 hour  Intake    805 ml  Output      0 ml  Net    805 ml    Last BM: 3/21   Labs:   Recent Labs Lab 08/04/13 1313 08/05/13 0547  NA 137 138  K 4.1 4.4  CL 99 99  CO2 24 22  BUN 15 14  CREATININE 1.16 1.05  CALCIUM 9.8 9.5  GLUCOSE 112* 95    CBG (last 3)  No results found for this basename: GLUCAP,  in the last 72 hours  Scheduled Meds: . heparin  5,000 Units Subcutaneous 3 times per day  . sodium chloride  3 mL Intravenous Q12H    Continuous Infusions: . sodium chloride Stopped (08/05/13 0534)  Past Medical History  Diagnosis Date  . Hyperkalemia     with ACE inhibitor  . Hypertension     controlled with Amlodipine  . Hyperlipidemia   . History of nephrolithiasis   . Myocardial infarction     July 1988/ Anterior MI with CABG in 1989  . Abnormal nuclear cardiac imaging test 2010    with apical akinesia with prior infarct. No ischemia  . History of heart attack   . GERD (gastroesophageal reflux disease)   . Coronary artery disease     Remote CABG in 1988 with last cath in 2006, last stress test in 2010;  LHC (7/13):   pRCA occluded, LM stent into the pLAD occluded, pOM1 occluded, pOM2 occluded, CFX 40%, S-PDA ok, S-OM1/OM2 ok, L-LAD atretic.  PCI:  POBA to the stent in the LM extending into the pLAD  . Prostate cancer     treated with radiation in 2000  . History of bladder cancer     Past Surgical History  Procedure Laterality Date  . Cystoscopy w/ ureteral stent placement  03/11/08  . Appendectomy    . Lithotripsy      right sided  . Coronary artery bypass graft      01-05-88  . Cardiac catheterization  03/07/05  . Coronary angioplasty  01/20/03    left main stent     Molli Barrows, RD, LDN, [place] Pager [phone] After Hours Pager [phone]

## 2013-08-05 NOTE — Evaluation (Addendum)
Physical Therapy Evaluation Patient Details Name: [name] MRN: [id number] DOB: 1923-06-19 Today's Date: 08/05/2013   History of Present Illness  80 y.o. male admitted to [facility] on 08/04/13 with FTT (multiple falls, presyncope, decreased po intake, increased confusion).  Chest x-ray revealed a lung mass and CT confirmed first mass, identified a second lung mass and a mass on his thyriod.  He desated with gait in the ED on RA.  Pt with significant PMHx of HTN, MI and CABG, CAD, prostate and bladder CA, dementia, and COPD.    Clinical Impression  Pt is extremely weak and deconditioned.  He is essentially at this point total care and was unable to stand with max assist from and elevated bed today.  Partial orthostatic vitals taken and were negative.  His safest d/c option would be SNF as he resides with an elderly sister who has recently fallen and broken her left wrist.  He has an aide 6 hours per day, 7 days per week and is active with HHPT through [place].  Family wishes to take the pt home and have hospice.  RN CM made aware.   PT to follow acutely for deficits listed below.     Follow Up Recommendations SNF    Equipment Recommendations  None recommended by PT    Recommendations for Other Services   None    Precautions / Restrictions Precautions Precautions: Fall Precaution Comments: h/o falls at home      Mobility  Bed Mobility Overal bed mobility: Needs Assistance Bed Mobility: Supine to Sit;Sit to Supine;Rolling Rolling: Max assist   Supine to sit: Total assist;HOB elevated Sit to supine: Total assist   General bed mobility comments: Pt needed total assist of trunk and legs to get to sitting EOB.  He needed just as much assist to get back supine in the bed.  Max assist to roll bil for clean up as pt had incontinent urine episode in the bed. He would reach toward, but not maintain grasp on the railing with rolling.   Transfers Overall transfer level: Needs  assistance Equipment used: Rolling walker (2 wheeled);None Transfers: Sit to/from Stand Sit to Stand: Max assist;From elevated surface         General transfer comment: attempted to stand x 2 from elevated bed.  Once with RW and once with therapist in front.  Neighter time was successful as pt pushed back against the RW and continued posterior lean/push when walker was removed.    Ambulation/Gait             General Gait Details: Unable due to too weak to stand.       Balance Overall balance assessment: Needs assistance Sitting-balance support: Bilateral upper extremity supported;Feet supported Sitting balance-Leahy Scale: Poor Sitting balance - Comments: Mod assist to prevent posterior LOB in sitting.  Postural control: Posterior lean Standing balance support: Bilateral upper extremity supported Standing balance-Leahy Scale: Zero Standing balance comment: unable to get to standing with and without RW and max support. Will need two peole for transfers or to attempt standing/gait next session.                       Pertinent Vitals/Pain  08/05/13 1139  Vital Signs  Pulse Rate 93  BP ! 151/86 mmHg  BP Location Right arm  BP Method Automatic  Patient Position, if appropriate Lying (HOB 63 degrees)  Oxygen Therapy  SpO2 93 %  O2 Device None (Room air)  08/05/13 1203  Vital Signs  Pulse Rate ! 106  BP ! 145/85 mmHg  BP Location Right arm  BP Method Automatic  Patient Position, if appropriate Sitting (sitting EOB, unable to get standing)  Oxygen Therapy  SpO2 92 %  O2 Device None (Room air)       Home Living Family/patient expects to be discharged to:: Private residence Living Arrangements: Other relatives (sister, [name]) Available Help at Discharge: Family;Available 24 hours/day;Friend(s);Personal care attendant (aid 7 days per week 6 hours per day, ) Type of Home: House Home Access: Level entry;Stairs to enter (4 steps on the back door with railing  on the left side only) Entrance Stairs-Rails: Left Entrance Stairs-Number of Steps: 4 Home Layout: One level Home Equipment: Walker - 2 wheels;Shower seat;Grab bars - tub/shower;Bedside commode Additional Comments: active with [name]- HHPT, RN, Education officer, museum.  Does not use home O2.    Prior Function Level of Independence: Needs assistance   Gait / Transfers Assistance Needed: hands on assist for gait.  Pt only walks with the home therapist and has had syncopal episodes with the therapist per his sister.  Sister was assisting with transfers to [name], but the two days prior to admission he was getting too much to handle, so she was using the urinal and waiting for the aide to get there.  Of note, the sister has recently brokern her left wrist and is not supposed to be using it to lift.    ADL's / Homemaking Assistance Needed: Sister helps with toileting and moving when the aide is not there.    Comments: Pt with flat affect, seems to be confused.  Has been declining physically in the last month.       Hand Dominance   Dominant Hand: Right    Extremity/Trunk Assessment   Upper Extremity Assessment: Defer to OT evaluation           Lower Extremity Assessment: Generalized weakness      Cervical / Trunk Assessment: Normal  Communication   Communication: No difficulties  Cognition Arousal/Alertness: Awake/alert Behavior During Therapy: Flat affect Overall Cognitive Status: History of cognitive impairments - at baseline Area of Impairment: Memory;Awareness;Problem solving;Orientation Orientation Level: Disoriented to;Person;Place;Time;Situation (knew some, but not all of sisters/releatives in room)   Memory:  (decreased STM and some LT memory)     Awareness: Intellectual Problem Solving: Slow processing;Decreased initiation;Difficulty sequencing;Requires verbal cues;Requires tactile cues      General Comments General comments (skin integrity, edema, etc.): Pt with sore, red  right heel, floated at end of session for pressure relief.            Assessment/Plan    PT Assessment Patient needs continued PT services  PT Diagnosis Difficulty walking;Abnormality of gait;Generalized weakness;Acute pain;Altered mental status   PT Problem List Decreased strength;Decreased activity tolerance;Decreased balance;Decreased mobility;Decreased coordination;Decreased cognition;Decreased knowledge of use of DME;Decreased safety awareness;Decreased knowledge of precautions;Cardiopulmonary status limiting activity;Pain  PT Treatment Interventions DME instruction;Gait training;Stair training;Functional mobility training;Therapeutic activities;Therapeutic exercise;Balance training;Neuromuscular re-education;Cognitive remediation;Patient/family education;Wheelchair mobility training;Modalities   PT Goals (Current goals can be found in the Care Plan section) Acute Rehab PT Goals Patient Stated Goal: family wants the pt to go home with present [facility] services and they would like to get hospice involved.   PT Goal Formulation: With family Time For Goal Achievement: 08/19/13 Potential to Achieve Goals: Fair    Frequency Min 3X/week   Barriers to discharge Decreased caregiver support sister, [name], is his primary caregiver when the aids are not  there and she is elderly with recent history of fall and left wrist fracture.  She is not supposed to be pulling with her left wrist.      End of Session Equipment Utilized During Treatment: Gait belt Activity Tolerance: Patient limited by fatigue Patient left: in bed;with call bell/phone within reach;with family/visitor present         Time: 1127-1213 PT Time Calculation (min): 46 min   Charges:   PT Evaluation $Initial PT Evaluation Tier I: 1 Procedure PT Treatments $Therapeutic Activity: 23-37 mins        Teegan Guinther B. [place], [place], DPT [phone]   08/05/2013, 12:54 PM

## 2013-08-05 NOTE — Evaluation (Signed)
Clinical/Bedside Swallow Evaluation Patient Details  Name: [name] MRN: [id number] Date of Birth: [birth year]  Today's Date: 08/05/2013 Time: 1015-1030 SLP Time Calculation (min): 15 min  Past Medical History:  Past Medical History  Diagnosis Date  . Hyperkalemia     with ACE inhibitor  . Hypertension     controlled with Amlodipine  . Hyperlipidemia   . History of nephrolithiasis   . Myocardial infarction     July 1988/ Anterior MI with CABG in 1989  . Abnormal nuclear cardiac imaging test 2010    with apical akinesia with prior infarct. No ischemia  . History of heart attack   . GERD (gastroesophageal reflux disease)   . Coronary artery disease     Remote CABG in 1988 with last cath in 2006, last stress test in 2010;  LHC (7/13):  pRCA occluded, LM stent into the pLAD occluded, pOM1 occluded, pOM2 occluded, CFX 40%, S-PDA ok, S-OM1/OM2 ok, L-LAD atretic.  PCI:  POBA to the stent in the LM extending into the pLAD  . Prostate cancer     treated with radiation in 2000  . History of bladder cancer    Past Surgical History:  Past Surgical History  Procedure Laterality Date  . Cystoscopy w/ ureteral stent placement  03/11/08  . Appendectomy    . Lithotripsy      right sided  . Coronary artery bypass graft      01-05-88  . Cardiac catheterization  03/07/05  . Coronary angioplasty  01/20/03    left main stent    HPI:  This is a 80 y.o. year old male with multiple medical problems including dementia, COPD, recurrent presyncope, ischemic cardiomyopathy, hx/o prostate and bladder cancer presenting with failure. Pt currently lives at home and has multiple family members she will just take care of him in addition to having a private home health nurse come in 6 hours a day. Per the niece, patient has had progressive decline over the past several months with multiple episodes of falls, nonspecific presyncopal spells, confusion, decreased by mouth intake. Patient's have formal  evaluation by cardiology for presyncopal spells.  .  Per the niece, patient has been evaluated multiple times in the ER for similar symptoms with otherwise negative workup. Per the niece, patient was not swallowing his pills today which was a reason for patient to the hospital.    Assessment / Plan / Recommendation Clinical Impression  Pt presents with a functional oropharyngeal swallow; no overt s/s of aspiration noted during BSE; general swallowing precautions recommended with regular (heart healthy) diet with thin liquids; meds whole with liquids as tolerated; no ST f/u recommended    Aspiration Risk  None    Diet Recommendation Regular;Thin liquid   Liquid Administration via: Cup;Straw Medication Administration: Whole meds with liquid Supervision: Patient able to self feed Compensations: Slow rate;Small sips/bites Postural Changes and/or Swallow Maneuvers: Seated upright 90 degrees    Other  Recommendations Oral Care Recommendations: Oral care BID   Follow Up Recommendations  None    Frequency and Duration     n/a   Pertinent Vitals/Pain BP elevated minimally; otherwise WDL    Swallow Study Prior Functional Status      General Date of Onset: 08/04/13 HPI: This is a 80 y.o. year old male with multiple medical problems including dementia, COPD, recurrent presyncope, ischemic cardiomyopathy, hx/o prostate and bladder cancer presenting with failure. Pt currently lives at home and has multiple family members she will just take care  of him in addition to having a private home health nurse come in 6 hours a day. Per the niece, patient has had progressive decline over the past several months with multiple episodes of falls, nonspecific presyncopal spells, confusion, decreased by mouth intake. Patient's have formal evaluation by cardiology for presyncopal spells.  .  Per the niece, patient has been evaluated multiple times in the ER for similar symptoms with otherwise negative workup. Per  the niece, patient was not swallowing his pills today which was a reason for patient to the hospital.  Type of Study: Bedside swallow evaluation Diet Prior to this Study: NPO Temperature Spikes Noted: No Respiratory Status: Nasal cannula Behavior/Cognition: Cooperative;Alert Oral Cavity - Dentition: Adequate natural dentition Self-Feeding Abilities: Able to feed self Patient Positioning: Upright in bed Baseline Vocal Quality: Clear Volitional Cough: Strong Volitional Swallow: Able to elicit    Oral/Motor/Sensory Function Overall Oral Motor/Sensory Function: Appears within functional limits for tasks assessed Labial ROM: Within Functional Limits Labial Symmetry: Within Functional Limits Labial Strength: Within Functional Limits Labial Sensation: Within Functional Limits Lingual ROM: Within Functional Limits Lingual Symmetry: Within Functional Limits Lingual Strength: Within Functional Limits Lingual Sensation: Within Functional Limits Facial ROM: Within Functional Limits Facial Symmetry: Within Functional Limits Facial Strength: Within Functional Limits Facial Sensation: Within Functional Limits Velum: Within Functional Limits Mandible: Within Functional Limits   Ice Chips Ice chips: Not tested   Thin Liquid Thin Liquid: Within functional limits Presentation: Cup;Straw;Spoon    Nectar Thick Nectar Thick Liquid: Not tested   Honey Thick Honey Thick Liquid: Not tested   Puree Puree: Within functional limits Presentation: Self Fed   Solid       Solid: Within functional limits Presentation: Self Fed       Merlyn Bollen,PAT, CCC-SLP 08/05/2013,10:51 AM

## 2013-08-05 NOTE — Progress Notes (Signed)
UR Completed Tausha Milhoan Graves-Bigelow, RN,BSN [phone]  

## 2013-08-05 NOTE — Progress Notes (Signed)
UR Completed Berea Majkowski Graves-Bigelow, RN,BSN [phone]  

## 2013-08-05 NOTE — Progress Notes (Signed)
Patient discontinued IV line. Unable to restart x 2 attempts. IV team also unable to restart with ultrasound.  Patient was receiving gentle IV fluid resuscitation. Admitted with failure to thrive and new, possible lung carcinomas per CT-angiogram of chest. Currently NPO for speech therapy consult.  Paged Chaney Malling, NP to make aware of situation.

## 2013-08-06 DIAGNOSIS — F09 Unspecified mental disorder due to known physiological condition: Secondary | ICD-10-CM

## 2013-08-06 DIAGNOSIS — I2510 Atherosclerotic heart disease of native coronary artery without angina pectoris: Secondary | ICD-10-CM

## 2013-08-06 DIAGNOSIS — J449 Chronic obstructive pulmonary disease, unspecified: Secondary | ICD-10-CM

## 2013-08-06 LAB — BASIC METABOLIC PANEL WITH GFR
BUN: 16 mg/dL (ref 6–23)
CO2: 25 meq/L (ref 19–32)
Calcium: 9.3 mg/dL (ref 8.4–10.5)
Chloride: 98 meq/L (ref 96–112)
Creatinine, Ser: 1.2 mg/dL (ref 0.50–1.35)
GFR calc Af Amer: 61 mL/min — ABNORMAL LOW (ref >90)
GFR calc non Af Amer: 53 mL/min — ABNORMAL LOW (ref >90)
Glucose, Bld: 108 mg/dL — ABNORMAL HIGH (ref 70–99)
Potassium: 4.2 meq/L (ref 3.7–5.3)
Sodium: 136 meq/L — ABNORMAL LOW (ref 137–147)

## 2013-08-06 LAB — CBC
HCT: 41.1 % (ref 39.0–52.0)
Hemoglobin: 14 g/dL (ref 13.0–17.0)
MCH: 31.7 pg (ref 26.0–34.0)
MCHC: 34.1 g/dL (ref 30.0–36.0)
MCV: 93 fL (ref 78.0–100.0)
PLATELETS: 229 10*3/uL (ref 150–400)
RBC: 4.42 MIL/uL (ref 4.22–5.81)
RDW: 13.5 % (ref 11.5–15.5)
WBC: 11.3 10*3/uL — AB (ref 4.0–10.5)

## 2013-08-06 LAB — PREALBUMIN: Prealbumin: 12.3 mg/dL — ABNORMAL LOW (ref 17.0–34.0)

## 2013-08-06 NOTE — Progress Notes (Signed)
Patient ID: [name]  male  WCB:[id number]    DOB: 10-11-1923    DOA: 08/04/2013  PCP: Tamsen Roers, MD  Assessment/Plan:   1-Failure to thrive; multifactorial contributions of dementia, primary versus metastatic cancer.  No evidence of infection UA with no WBC, CT no evidence of infection.  TSH within normal range.   2-Lung Mass: CT chest showed 1.5 x 2 cm irregular mass within the anterior right upper lobe is compatible with neoplasm.  A 1.1 cm nodule along the posterior superior right major fissure this suspicious for neoplasm/ metastasis. Adjacent  peripheral 1.3 cm soft tissue/mass or atelectasis noted.  - Palliative care consult for goals of care.   3-COPD currently stable - will order PRN nebulizer.   4-Cardiomyopathy; resume metoprolol and Isordil.   5-Leukocytosis; no evidence of infection.   6-hx/o prostate and bladder cancer;   DVT Prophylaxis: Heparin subcutaneous  Code Status: Full code  Family Communication:  Disposition: Will need skilled nursing facility  Consultants:  Palliative care  Procedures:  None  Antibiotics:  None    Subjective: Patient seen and examined, alert and awake, does not want to eat  Objective: Weight change: -7.757 kg (-17 lb 1.6 oz)  Intake/Output Summary (Last 24 hours) at 08/06/13 1310 Last data filed at 08/06/13 0511  Gross per 24 hour  Intake      0 ml  Output    100 ml  Net   -100 ml   Blood pressure 166/91, pulse 97, temperature 98.8 F (37.1 C), temperature source Oral, resp. rate 18, height 5\' 7"  (1.702 m), weight 58.922 kg (129 lb 14.4 oz), SpO2 99.00%.  Physical Exam: General: Alert and awake, oriented x3, not in any acute distress. CVS: S1-S2 clear, no murmur rubs or gallops Chest: clear to auscultation bilaterally, no wheezing, rales or rhonchi Abdomen: soft nontender, nondistended, normal bowel sounds  Extremities: no cyanosis, clubbing , trace  edema noted bilaterally   Lab Results: Basic  Metabolic Panel:  Recent Labs Lab 08/05/13 0547 08/06/13 0452  NA 138 136*  K 4.4 4.2  CL 99 98  CO2 22 25  GLUCOSE 95 108*  BUN 14 16  CREATININE 1.05 1.20  CALCIUM 9.5 9.3   Liver Function Tests:  Recent Labs Lab 08/04/13 1313 08/05/13 0547  AST 15 14  ALT <5 <5  ALKPHOS 88 83  BILITOT 1.6* 1.3*  PROT 7.1 6.6  ALBUMIN 3.2* 2.8*    Recent Labs Lab 08/04/13 1313  LIPASE 27   No results found for this basename: AMMONIA,  in the last 168 hours CBC:  Recent Labs Lab 08/05/13 0547 08/06/13 0452  WBC 13.8* 11.3*  NEUTROABS 11.2*  --   HGB 15.6 14.0  HCT 46.4 41.1  MCV 93.5 93.0  PLT 208 229   Cardiac Enzymes: No results found for this basename: CKTOTAL, CKMB, CKMBINDEX, TROPONINI,  in the last 168 hours BNP: No components found with this basename: POCBNP,  CBG: No results found for this basename: GLUCAP,  in the last 168 hours   Micro Results: No results found for this or any previous visit (from the past 240 hour(s)).  Studies/Results: Dg Chest 2 View  08/04/2013   CLINICAL DATA:  Failure to thrive, cough, chest pain. Altered mental status.  EXAM: CHEST  2 VIEW  COMPARISON:  06/23/2013  FINDINGS: Heart is normal size. Prior median sternotomy. Left lung is clear. Right upper lobe pulmonary nodule measures approximately 1.8 cm, stable. No effusions. No acute bony abnormality.  IMPRESSION: Stable approximately 1.8 cm right upper lobe pulmonary nodule.  No acute findings.   Electronically Signed   By: Rolm Baptise M.D.   On: 08/04/2013 14:41   Ct Angio Chest Pe W/cm &/or Wo Cm  08/04/2013   CLINICAL DATA:  Shortness of breath, hypoxia and elevated D-dimer.  EXAM: CT ANGIOGRAPHY CHEST WITH CONTRAST  TECHNIQUE: Multidetector CT imaging of the chest was performed using the standard protocol during bolus administration of intravenous contrast. Multiplanar CT image reconstructions and MIPs were obtained to evaluate the vascular anatomy.  CONTRAST:  43mL OMNIPAQUE  IOHEXOL 350 MG/ML SOLN  COMPARISON:  01/16/2012 and prior chest CTs  FINDINGS: This is a technically satisfactory study.  No pulmonary emboli are identified.  There is no evidence thoracic aortic aneurysm.  Mild cardiomegaly, cardiac surgical changes and coronary artery calcifications again noted.  There are no pleural or pericardial effusions.  A 1.5 x 2 cm irregular mass within the anterior right upper lobe (image 28) is compatible with neoplasm.  A 1.1 cm nodule along the posterior superior right major fissure (image 41) this suspicious for neoplasm/ metastasis. Adjacent peripheral 1.3 cm soft tissue/mass or atelectasis noted.  No enlarged lymph nodes are identified.  A 6 mm right upper lobe nodule (image 35) is unchanged.  Mild bibasilar atelectasis/scarring noted.  Right basilar pleural calcifications are present.  The visualized upper abdomen is unremarkable.  No acute or suspicious bony abnormalities are identified.  Review of the MIP images confirms the above findings.  IMPRESSION: No evidence of pulmonary emboli or thoracic aortic aneurysm.  1.5 x 2 cm irregular anterior right upper lobe mass compatible with neoplasm/bronchogenic carcinoma.  1.1 cm nodule and adjacent 1.3 cm soft tissue along the posterior upper right major fissure suspicious for neoplasm/metastasis.  Cardiomegaly and prior cardiac surgical changes.   Electronically Signed   By: Hassan Rowan M.D.   On: 08/04/2013 17:45   Ct Cervical Spine Wo Contrast  08/04/2013   CLINICAL DATA:  Failure to thrive  EXAM: CT HEAD WITHOUT CONTRAST  CT CERVICAL SPINE WITHOUT CONTRAST  TECHNIQUE: Multidetector CT imaging of the head and cervical spine was performed following the standard protocol without intravenous contrast. Multiplanar CT image reconstructions of the cervical spine were also generated.  COMPARISON:  Prior head CT 11/05/2011  FINDINGS: CT HEAD FINDINGS  Negative for acute intracranial hemorrhage, acute infarction, mass, mass effect,  hydrocephalus or midline shift. Gray-white differentiation is preserved throughout. No significant interval change in the appearance of diffuse cerebral and cerebellar atrophy with ex vacuo ventriculomegaly of the lateral ventricles. Advanced confluent periventricular, subcortical and deep white matter hypoattenuation is most consistent with the sequelae of longstanding microvascular ischemic white matter disease. No focal soft tissue or calvarial abnormality. Left mastoid effusion. Fluid extends into the middle ear. The right mastoid air cells and visualized paranasal sinuses are well aerated. Atherosclerotic calcifications noted in the bilateral cavernous and supra clinoid internal carotid arteries.  CT CERVICAL SPINE FINDINGS  No acute fracture, malalignment or prevertebral soft tissue swelling. Multilevel degenerative disc disease most significant at C6-C7. Additionally, there is uncovertebral joint hypertrophy on the left at C4-C5 and C5-C6 resulting in a minimal foraminal narrowing. Advanced bulky degenerative changes are noted at the atlantodental interval. There is fragmentation versus dystrophic calcification in the associated transverse ligament as well as a subchondral cyst in the dens. No aggressive appearing lytic or blastic osseous lesion. Heterogeneous thyroid gland. There is a circumscribed 1.2 cm low-attenuation nodule in the mid aspect  of the right gland. Atherosclerotic vascular calcifications noted in the bilateral carotid arteries. No acute soft tissue abnormality. The lung apices are unremarkable.  IMPRESSION: CT HEAD  1. No acute intracranial abnormality. 2. Stable advanced cerebral and cerebellar atrophy A and advanced chronic microvascular ischemic white matter disease. 3. Left mastoid effusion.  Query clinical symptoms of mastoiditis? 4. Atherosclerotic calcification of the bilateral intracranial internal carotid arteries. CT C-SPINE  1. No acute fracture or malalignment. 2. Advanced bulky  degenerative changes at the atlantodental interval. Differential considerations include advanced degenerative osteoarthritis, calcium pyrophosphate deposition disease, and rheumatoid arthritis. 3. Additional degenerative cervical spondylosis most notable at C6-C7. 4. Incidentally imaged 1.2 cm low-attenuation nodule in the mid right thyroid gland. 5. Carotid artery atherosclerotic vascular calcifications.   Electronically Signed   By: Jacqulynn Cadet M.D.   On: 08/04/2013 16:53    Medications: Scheduled Meds: . aspirin  81 mg Oral Daily  . feeding supplement (ENSURE COMPLETE)  237 mL Oral BID BM  . heparin  5,000 Units Subcutaneous 3 times per day  . isosorbide dinitrate  10 mg Oral Daily  . metoprolol tartrate  25 mg Oral Daily  . simvastatin  40 mg Oral q1800  . sodium chloride  3 mL Intravenous Q12H      LOS: 2 days   RAI,RIPUDEEP M.D. [facility] 08/06/2013, 1:10 PM Pager: [phone]  If 7PM-7AM, please contact night-coverage [email or web address] Password [email or web address]

## 2013-08-06 NOTE — Progress Notes (Signed)
Thank you for consulting the Palliative Medicine Team at [facility] to meet your patient's and family's needs.   The reason that you asked [place] to see your patient is GOC and options.   We have scheduled your patient for a meeting: 3/26 1200 noon  The Surrogate decision make is: Lulu Riding (sister) Contact information: [phone], [phone]  Other family members that need to be present: at pt and Faye's discretion    Your patient is able/unable to participate: likely able  Vinie Sill, NP Palliative Medicine Team Pager # [phone] (M-F 8a-5p) Team Phone # [phone] (Nights/Weekends)

## 2013-08-06 NOTE — Progress Notes (Signed)
Patient [name]      DOB: March 28, 1924      TXH:[id number] The PMT has received your consult and will get this scheduled as soon as possible.   Taylorann Tkach L. Lovena Le, MD MBA The Palliative Medicine Team at [facility] Phone: [phone] Pager: [phone]

## 2013-08-06 NOTE — Progress Notes (Signed)
Physical Therapy Treatment Patient Details Name: [name] MRN: [id number] DOB: 1923/11/22 Today's Date: 08/06/2013    History of Present Illness 80 y.o. male admitted to [facility] on 08/04/13 with FTT (multiple falls, presyncope, decreased po intake, increased confusion).  Chest x-ray revealed a lung mass and CT confirmed first mass, identified a second lung mass and a mass on his thyriod.  He desated with gait in the ED on RA.  Pt with significant PMHx of HTN, MI and CABG, CAD, prostate and bladder CA, dementia, and COPD.      PT Comments    Pt able to transfer to chair this session with 2 person (A). Discussed with RN proper technique for transfer with use of draw pad and gt belt; also recommend lift if RN not comfortable or does not have 2nd person for (A). Pt requires (A) and max cues with all exercises to perform proper muscle isolation. Pt with limited ROM in bil LEs. POC remains appropriate. Palliative team has been consulted.  Follow Up Recommendations  SNF     Equipment Recommendations  None recommended by PT    Recommendations for Other Services       Precautions / Restrictions Precautions Precautions: Fall Precaution Comments: h/o falls at home Restrictions Weight Bearing Restrictions: No    Mobility  Bed Mobility Overal bed mobility: Needs Assistance Bed Mobility: Supine to Sit     Supine to sit: Max assist;HOB elevated     General bed mobility comments: pt with continuous posterior lean; required total (A) to acheive trunk and LE positioning in sitting position at EOB; pt with difficulty using (A) with UEs; Rt UE kept falling off handrails; max cues for sequencing   Transfers Overall transfer level: Needs assistance Equipment used: 2 person hand held assist Transfers: Sit to/from [facility] Sit to Stand: Total assist;+2 physical assistance;From elevated surface Stand pivot transfers: Total assist;+2 physical assistance       General  transfer comment: able to achieve standing with fwd flexed posture; pt very fearful of falling; use of gt belt and draw pad to bring pt up and facilitate pivotal steps to chair; max cues for sequencing; pt very unsteady and tremorous LEs due to weakness   Ambulation/Gait             General Gait Details: unable to safely assess today    Stairs            Wheelchair Mobility    Modified Rankin (Stroke Patients Only)       Balance Overall balance assessment: History of Falls;Needs assistance Sitting-balance support: Bilateral upper extremity supported;Feet supported Sitting balance-Leahy Scale: Poor Sitting balance - Comments: min (A) to supervision (A) to maintain upright posture; relies heavily on UE support; posterior lean throughout sitting; tolerated sitting 10 min prior to transfer  Postural control: Posterior lean Standing balance support: During functional activity;Bilateral upper extremity supported Standing balance-Leahy Scale: Zero Standing balance comment: requires 2 person (A) to maintain balance                     Cognition Arousal/Alertness: Awake/alert Behavior During Therapy: Flat affect Overall Cognitive Status: History of cognitive impairments - at baseline Area of Impairment: Orientation;Following commands;Memory;Awareness;Problem solving;Safety/judgement Orientation Level: Disoriented to;Place;Time;Situation     Following Commands: Follows one step commands inconsistently;Follows one step commands with increased time Safety/Judgement: Decreased awareness of safety;Decreased awareness of deficits Awareness: Intellectual Problem Solving: Slow processing;Decreased initiation;Difficulty sequencing;Requires verbal cues;Requires tactile cues General Comments: pt continuously  asking what were doing; pt disoriented to his birthday today     Exercises General Exercises - Lower Extremity Ankle Circles/Pumps: AAROM;Both;10 reps;Supine;Other  (comment) (max cues for all exercises ) Long Arc Quad: AAROM;Both;10 reps;Seated Heel Slides: AAROM;Both;10 reps;Supine;Other (comment) (limited ROM ) Hip ABduction/ADduction: AAROM;Both;10 reps;Supine    General Comments General comments (skin integrity, edema, etc.): heels floated at end of session in recliner       Pertinent Vitals/Pain No complaints                                       PT Goals (current goals can now be found in the care plan section) Acute Rehab PT Goals Patient Stated Goal: none stated today PT Goal Formulation: With family Time For Goal Achievement: 08/19/13 Potential to Achieve Goals: Fair Progress towards PT goals: Progressing toward goals    Frequency  Min 3X/week    PT Plan Current plan remains appropriate    End of Session Equipment Utilized During Treatment: Gait belt;Oxygen Activity Tolerance: Patient limited by fatigue Patient left: in chair;with call bell/phone within reach     Time: 1027-1052 PT Time Calculation (min): 25 min  Charges:  $Therapeutic Exercise: 8-22 mins $Therapeutic Activity: 8-22 mins                    G CodesGustavus Bryant, [place] [phone] 08/06/2013, 12:46 PM

## 2013-08-06 NOTE — Progress Notes (Signed)
OT Cancellation Note  Patient Details Name: [name] MRN: [id number] DOB: 1923-11-18   Cancelled Treatment:    Reason Eval/Treat Not Completed: OT screened, no needs identified, will sign off (all OT needs can be met in next venue of care -- SNF. Patient required extensive A with ADLs at baseline.)  [name] A 08/06/2013, 2:21 PM

## 2013-08-07 DIAGNOSIS — R918 Other nonspecific abnormal finding of lung field: Secondary | ICD-10-CM

## 2013-08-07 DIAGNOSIS — Z515 Encounter for palliative care: Secondary | ICD-10-CM

## 2013-08-07 DIAGNOSIS — R222 Localized swelling, mass and lump, trunk: Principal | ICD-10-CM

## 2013-08-07 DIAGNOSIS — R55 Syncope and collapse: Secondary | ICD-10-CM

## 2013-08-07 NOTE — Progress Notes (Signed)
Pharmacist Heart Failure Core Measure Documentation  Assessment: [name] has an EF documented as 30-35% on 03/10/2013 by echo.  Rationale: Heart failure patients with left ventricular systolic dysfunction (LVSD) and an EF < 40% should be prescribed an angiotensin converting enzyme inhibitor (ACEI) or angiotensin receptor blocker (ARB) at discharge unless a contraindication is documented in the medical record.  This patient is not currently on an ACEI or ARB for HF.  This note is being placed in the record in order to provide documentation that a contraindication to the use of these agents is present for this encounter.  ACE Inhibitor or Angiotensin Receptor Blocker is contraindicated (specify all that apply)  []   ACEI allergy AND ARB allergy []   Angioedema []   Moderate or severe aortic stenosis []   Hyperkalemia [x]   History of orthostatic hypotension per outpatient cardiology note 03/2013) []   Renal artery stenosis []   Worsening renal function, preexisting renal disease or dysfunction  [facility], Pharm.D., BCPS Clinical Pharmacist Pager: [phone] 08/07/2013 9:28 AM

## 2013-08-07 NOTE — Clinical Social Work Psychosocial (Signed)
Clinical Social Work Department  BRIEF PSYCHOSOCIAL ASSESSMENT  Patient: [name]  Account Number: [id number]  Admit date:  08/04/13 Clinical Social Worker Rhea Pink, MSW Date/Time: 08/07/2013 Referred by: Physician Date Referred: NA Referred for   SNF Placement   Other Referral:  Interview type: Patient's sister  Other interview type: PSYCHOSOCIAL DATA  Living Status: Family Admitted from facility:  Level of care:  Primary support name: Lulu Riding Primary support relationship to patient:sister  Degree of support available:  Strong and vested  CURRENT CONCERNS  Current Concerns   Post-Acute Placement   Other Concerns:  SOCIAL WORK ASSESSMENT / PLAN  CSW met with patient's sister to discuss SNF placement. Patient's sister was sad. She reported we just met the the palliative team and we made my brother a DNR" CSW explained the placement process and asked what facilities would be closest. Patient's sister who is MPOA reported that she would like [facility] or [facility].  CSW offered support and and promised to follow up with the patient in the am.             Assessment/plan status: Information/Referral to [employer]  Other assessment/ plan:  Information/referral to community resources:  SNF   PTAR  PATIENT'S/FAMILY'S RESPONSE TO PLAN OF CARE:  Patient' sister was sad but stated that her brother was getting excellent care and she was appreciative of support and information provided by CSW.   Rhea Pink, MSW, [place]

## 2013-08-07 NOTE — Progress Notes (Addendum)
Patient ID: [name]  male  LFY:[id number]    DOB: 07/12/1923    DOA: 08/04/2013  PCP: Tamsen Roers, MD  Assessment/Plan:   1-Failure to thrive; multifactorial contributions of dementia, primary versus metastatic cancer.  No evidence of infection UA with no WBC, CT no evidence of infection.  TSH within normal range.   2-Lung Mass: CT chest showed 1.5 x 2 cm irregular mass within the anterior right upper lobe is compatible with neoplasm.  A 1.1 cm nodule along the posterior superior right major fissure this suspicious for neoplasm/ metastasis. Adjacent  peripheral 1.3 cm soft tissue/mass or atelectasis noted.  - Palliative care meeting for [place] today .  - Poor appetite, hardly eating  3-COPD currently stable Continue when necessary nebulizer  4-Cardiomyopathy with chronic systolic CHF  - resume metoprolol and Isordil.  - Last echo in 02-2013 showed EF of 30-35%, not on ACE inhibitors due to orthostatic hypotension per cardiology office notes in 2014  5-Leukocytosis; no evidence of infection.   6-hx/o prostate and bladder cancer;   DVT Prophylaxis: Heparin subcutaneous  Code Status: Full code  Family Communication:  Disposition: Will need skilled nursing facility, likely DC in a.m. if skilled nursing facility bed is available  Consultants:  Palliative care  Procedures:  None  Antibiotics:  None    Subjective: Patient seen and examined, somewhat somnolent, does not want to eat denies any specific complaints  Objective: Weight change:   Intake/Output Summary (Last 24 hours) at 08/07/13 1209 Last data filed at 08/07/13 0514  Gross per 24 hour  Intake    200 ml  Output      0 ml  Net    200 ml   Blood pressure 153/61, pulse 98, temperature 98.3 F (36.8 C), temperature source Oral, resp. rate 18, height 5\' 7"  (1.702 m), weight 58.922 kg (129 lb 14.4 oz), SpO2 100.00%.  Physical Exam: General: Somewhat lethargic today CVS: S1-S2 clear, no murmur rubs  or gallops Chest: clear to auscultation bilaterally, no wheezing, rales or rhonchi Abdomen: soft nontender, nondistended, normal bowel sounds  Extremities: no cyanosis, clubbing , trace  edema noted bilaterally   Lab Results: Basic Metabolic Panel:  Recent Labs Lab 08/05/13 0547 08/06/13 0452  NA 138 136*  K 4.4 4.2  CL 99 98  CO2 22 25  GLUCOSE 95 108*  BUN 14 16  CREATININE 1.05 1.20  CALCIUM 9.5 9.3   Liver Function Tests:  Recent Labs Lab 08/04/13 1313 08/05/13 0547  AST 15 14  ALT <5 <5  ALKPHOS 88 83  BILITOT 1.6* 1.3*  PROT 7.1 6.6  ALBUMIN 3.2* 2.8*    Recent Labs Lab 08/04/13 1313  LIPASE 27   No results found for this basename: AMMONIA,  in the last 168 hours CBC:  Recent Labs Lab 08/05/13 0547 08/06/13 0452  WBC 13.8* 11.3*  NEUTROABS 11.2*  --   HGB 15.6 14.0  HCT 46.4 41.1  MCV 93.5 93.0  PLT 208 229   Cardiac Enzymes: No results found for this basename: CKTOTAL, CKMB, CKMBINDEX, TROPONINI,  in the last 168 hours BNP: No components found with this basename: POCBNP,  CBG: No results found for this basename: GLUCAP,  in the last 168 hours   Micro Results: No results found for this or any previous visit (from the past 240 hour(s)).  Studies/Results: Dg Chest 2 View  08/04/2013   CLINICAL DATA:  Failure to thrive, cough, chest pain. Altered mental status.  EXAM: CHEST  2 VIEW  COMPARISON:  06/23/2013  FINDINGS: Heart is normal size. Prior median sternotomy. Left lung is clear. Right upper lobe pulmonary nodule measures approximately 1.8 cm, stable. No effusions. No acute bony abnormality.  IMPRESSION: Stable approximately 1.8 cm right upper lobe pulmonary nodule.  No acute findings.   Electronically Signed   By: Rolm Baptise M.D.   On: 08/04/2013 14:41   Ct Angio Chest Pe W/cm &/or Wo Cm  08/04/2013   CLINICAL DATA:  Shortness of breath, hypoxia and elevated D-dimer.  EXAM: CT ANGIOGRAPHY CHEST WITH CONTRAST  TECHNIQUE: Multidetector CT  imaging of the chest was performed using the standard protocol during bolus administration of intravenous contrast. Multiplanar CT image reconstructions and MIPs were obtained to evaluate the vascular anatomy.  CONTRAST:  78mL OMNIPAQUE IOHEXOL 350 MG/ML SOLN  COMPARISON:  01/16/2012 and prior chest CTs  FINDINGS: This is a technically satisfactory study.  No pulmonary emboli are identified.  There is no evidence thoracic aortic aneurysm.  Mild cardiomegaly, cardiac surgical changes and coronary artery calcifications again noted.  There are no pleural or pericardial effusions.  A 1.5 x 2 cm irregular mass within the anterior right upper lobe (image 28) is compatible with neoplasm.  A 1.1 cm nodule along the posterior superior right major fissure (image 41) this suspicious for neoplasm/ metastasis. Adjacent peripheral 1.3 cm soft tissue/mass or atelectasis noted.  No enlarged lymph nodes are identified.  A 6 mm right upper lobe nodule (image 35) is unchanged.  Mild bibasilar atelectasis/scarring noted.  Right basilar pleural calcifications are present.  The visualized upper abdomen is unremarkable.  No acute or suspicious bony abnormalities are identified.  Review of the MIP images confirms the above findings.  IMPRESSION: No evidence of pulmonary emboli or thoracic aortic aneurysm.  1.5 x 2 cm irregular anterior right upper lobe mass compatible with neoplasm/bronchogenic carcinoma.  1.1 cm nodule and adjacent 1.3 cm soft tissue along the posterior upper right major fissure suspicious for neoplasm/metastasis.  Cardiomegaly and prior cardiac surgical changes.   Electronically Signed   By: Hassan Rowan M.D.   On: 08/04/2013 17:45   Ct Cervical Spine Wo Contrast  08/04/2013   CLINICAL DATA:  Failure to thrive  EXAM: CT HEAD WITHOUT CONTRAST  CT CERVICAL SPINE WITHOUT CONTRAST  TECHNIQUE: Multidetector CT imaging of the head and cervical spine was performed following the standard protocol without intravenous contrast.  Multiplanar CT image reconstructions of the cervical spine were also generated.  COMPARISON:  Prior head CT 11/05/2011  FINDINGS: CT HEAD FINDINGS  Negative for acute intracranial hemorrhage, acute infarction, mass, mass effect, hydrocephalus or midline shift. Gray-white differentiation is preserved throughout. No significant interval change in the appearance of diffuse cerebral and cerebellar atrophy with ex vacuo ventriculomegaly of the lateral ventricles. Advanced confluent periventricular, subcortical and deep white matter hypoattenuation is most consistent with the sequelae of longstanding microvascular ischemic white matter disease. No focal soft tissue or calvarial abnormality. Left mastoid effusion. Fluid extends into the middle ear. The right mastoid air cells and visualized paranasal sinuses are well aerated. Atherosclerotic calcifications noted in the bilateral cavernous and supra clinoid internal carotid arteries.  CT CERVICAL SPINE FINDINGS  No acute fracture, malalignment or prevertebral soft tissue swelling. Multilevel degenerative disc disease most significant at C6-C7. Additionally, there is uncovertebral joint hypertrophy on the left at C4-C5 and C5-C6 resulting in a minimal foraminal narrowing. Advanced bulky degenerative changes are noted at the atlantodental interval. There is fragmentation versus dystrophic calcification in  the associated transverse ligament as well as a subchondral cyst in the dens. No aggressive appearing lytic or blastic osseous lesion. Heterogeneous thyroid gland. There is a circumscribed 1.2 cm low-attenuation nodule in the mid aspect of the right gland. Atherosclerotic vascular calcifications noted in the bilateral carotid arteries. No acute soft tissue abnormality. The lung apices are unremarkable.  IMPRESSION: CT HEAD  1. No acute intracranial abnormality. 2. Stable advanced cerebral and cerebellar atrophy A and advanced chronic microvascular ischemic white matter  disease. 3. Left mastoid effusion.  Query clinical symptoms of mastoiditis? 4. Atherosclerotic calcification of the bilateral intracranial internal carotid arteries. CT C-SPINE  1. No acute fracture or malalignment. 2. Advanced bulky degenerative changes at the atlantodental interval. Differential considerations include advanced degenerative osteoarthritis, calcium pyrophosphate deposition disease, and rheumatoid arthritis. 3. Additional degenerative cervical spondylosis most notable at C6-C7. 4. Incidentally imaged 1.2 cm low-attenuation nodule in the mid right thyroid gland. 5. Carotid artery atherosclerotic vascular calcifications.   Electronically Signed   By: Jacqulynn Cadet M.D.   On: 08/04/2013 16:53    Medications: Scheduled Meds: . aspirin  81 mg Oral Daily  . feeding supplement (ENSURE COMPLETE)  237 mL Oral BID BM  . heparin  5,000 Units Subcutaneous 3 times per day  . isosorbide dinitrate  10 mg Oral Daily  . metoprolol tartrate  25 mg Oral Daily  . simvastatin  40 mg Oral q1800  . sodium chloride  3 mL Intravenous Q12H      LOS: 3 days   Akemi Overholser M.D. [facility] 08/07/2013, 12:09 PM Pager: [phone]  If 7PM-7AM, please contact night-coverage [email or web address] Password [email or web address]

## 2013-08-07 NOTE — Progress Notes (Signed)
CSW called patient's sister to discuss SNF options. Patient's sister did not want to talk SNF options until after her meeting with palliative care at 12:00. CSW will follow up with family after the meeting.   Rhea Pink, MSW, [place]

## 2013-08-07 NOTE — Clinical Social Work Placement (Addendum)
Clinical Social Work Department  CLINICAL SOCIAL WORK PLACEMENT NOTE   Patient:Ronel B Rockholt Account Number: [id number] Admit date: 08/03/13 Clinical Social Worker: Rhea Pink LCSWA Date/time: 08/07/2013 11:30 AM  Clinical Social Work is seeking post-discharge placement for this patient at the following level of care: SKILLED NURSING (*CSW will update this form in Epic as items are completed)  08/07/2013 Patient/family provided with [place] Department of Clinical Social Work's list of facilities offering this level of care within the geographic area requested by the patient (or if unable, by the patient's family).  08/07/2013 Patient/family informed of their freedom to choose among providers that offer the needed level of care, that participate in Medicare, Medicaid or managed care program needed by the patient, have an available bed and are willing to accept the patient.  3/26/2015Patient/family informed of [facility]' ownership interest in [facility], as well as of the fact that they are under no obligation to receive care at this facility.  PASARR submitted to EDS on 08/08/2013 PASARR number received from EDS on 08/08/2013  FL2 transmitted to all facilities in geographic area requested by pt/family on 08/07/2013  FL2 transmitted to all facilities within larger geographic area on  Patient informed that his/her managed care company has contracts with or will negotiate with certain facilities, including the following:  Patient/family informed of bed offers received: 08/08/2013 Patient chooses bed at [facility] Physician recommends and patient chooses bed at  Patient to be transferred to on 08/08/2013 Patient to be transferred to facility by [facility] The following physician request were entered in Epic:  Additional Comments:

## 2013-08-07 NOTE — Consult Note (Signed)
Patient [name]      DOB: 01/16/1924      UDJ:[id number]     Consult Note from the Palliative Medicine Team at [place] Requested by: Dr. Tyrell Antonio    PCP: Tamsen Roers, MD Reason for Consultation: [place] and options.   Phone Number:[phone]  Assessment of patients Current state: [name] is a 80 yo male with dementia, COPD, recurrent presyncope, cardiomyopathy, and history of prostate and bladder cancer. CT of chest revealed RUL mass and nodule in soft tissue along the posterior upper right major fissure suspsicious for neoplasm or metastasis.   I met today with [name] family: sister [place] [phone], [phone], [phone]) and HCPOAs sister [name] [phone]) and nephew Shanon Brow [phone]), and his niece Vaughan Basta. [name] has been his caregiver since September and he has steadily declined required help with all activities of daily living (dressing, ambulating) and has had frequent falls with associated presyncopal episodes. He has also had increased confusion and decreased intake. He has also had numerous visits to the ED with no significant results found and continuing decline at home that has been especially difficult on the family, especially [name] who has been caring for him and has recently broken her arm. They have had assistance from [employer] with aides 6 hours a day. We discussed the natural progression of his dementia and also the lung mass. They agree they do not want work up or treatment for the lung mass. They wish to focus on comfort and quality of life. They wish to make him DNR at this point. We also discussed hospice care. They are wanting to look into SNF with rehab and palliative/hospice to follow and help transition to hospice facility when appropriate. We also discussed MOST form but did not complete today. I will continue to follow and support [name] and his family.   Goals of Care: 1.  Code Status: DNR   2. Scope of Treatment:  Continue medical treatment. No limitations outside DNR and no feeding tube placement at this time.   4. Disposition: SNF with palliative/hospice.    3. Symptom Management:   1. Weakness: Continue medical management. PT following.  4. Psychosocial: Emotional support provided to patient and family.   Patient Documents Completed or Given: Document Given Completed  Advanced Directives Pkt    MOST yes   DNR  yes  Gone from My [facility]    Hard Choices yes     Brief HPI: 80 yo male with dementia, COPD, recurrent presyncope, cardiomyopathy, and history of prostate and bladder cancer. CT of chest revealed RUL mass and nodule in soft tissue along the posterior upper right major fissure suspsicious for neoplasm or metastasis.   ROS: Denies pain, nausea, constipation    PMH:  Past Medical History  Diagnosis Date  . Hyperkalemia     with ACE inhibitor  . Hypertension     controlled with Amlodipine  . Hyperlipidemia   . History of nephrolithiasis   . Myocardial infarction     July 1988/ Anterior MI with CABG in 1989  . Abnormal nuclear cardiac imaging test 2010    with apical akinesia with prior infarct. No ischemia  . History of heart attack   . GERD (gastroesophageal reflux disease)   . Coronary artery disease     Remote CABG in 1988 with last cath in 2006, last stress test in 2010;  LHC (7/13):  pRCA occluded, LM stent into the pLAD occluded, pOM1 occluded,  pOM2 occluded, CFX 40%, S-PDA ok, S-OM1/OM2 ok, L-LAD atretic.  PCI:  POBA to the stent in the LM extending into the pLAD  . Prostate cancer     treated with radiation in 2000  . History of bladder cancer      PSH: Past Surgical History  Procedure Laterality Date  . Cystoscopy w/ ureteral stent placement  03/11/08  . Appendectomy    . Lithotripsy      right sided  . Coronary artery bypass graft      01-05-88  . Cardiac catheterization  03/07/05  . Coronary angioplasty  01/20/03    left main stent    I have reviewed  the [place] and SH and  If appropriate update it with new information. No Known Allergies Scheduled Meds: . aspirin  81 mg Oral Daily  . feeding supplement (ENSURE COMPLETE)  237 mL Oral BID BM  . heparin  5,000 Units Subcutaneous 3 times per day  . isosorbide dinitrate  10 mg Oral Daily  . metoprolol tartrate  25 mg Oral Daily  . simvastatin  40 mg Oral q1800  . sodium chloride  3 mL Intravenous Q12H   Continuous Infusions: . sodium chloride Stopped (08/05/13 0534)   PRN Meds:.ipratropium-albuterol    BP 153/61  Pulse 98  Temp(Src) 98.3 F (36.8 C) (Oral)  Resp 18  Ht '5\' 7"'  (1.702 m)  Wt 58.922 kg (129 lb 14.4 oz)  BMI 20.34 kg/m2  SpO2 100%   PPS: 30%   Intake/Output Summary (Last 24 hours) at 08/07/13 1325 Last data filed at 08/07/13 0514  Gross per 24 hour  Intake    200 ml  Output      0 ml  Net    200 ml   LBM: 08/02/13                         Physical Exam:  General: NAD, lethargic HEENT: [place]/AT, no JVD, dry mucous membranes Chest: CTA throughout, no labored breathing, symmetric  CVS: RRR, S1 S2 Abdomen: Soft, NT, ND, +BS Ext: MAE, no edema, warm to touch Neuro: Arousable, confused, follows commands  Labs: CBC    Component Value Date/Time   WBC 11.3* 08/06/2013 0452   WBC 6.9 03/13/2011 0941   RBC 4.42 08/06/2013 0452   RBC 4.78 03/13/2011 0941   HGB 14.0 08/06/2013 0452   HGB 15.4 03/13/2011 0941   HCT 41.1 08/06/2013 0452   HCT 45.5 03/13/2011 0941   PLT 229 08/06/2013 0452   PLT 173 03/13/2011 0941   MCV 93.0 08/06/2013 0452   MCV 95.3 03/13/2011 0941   MCH 31.7 08/06/2013 0452   MCH 32.3 03/13/2011 0941   MCHC 34.1 08/06/2013 0452   MCHC 33.9 03/13/2011 0941   RDW 13.5 08/06/2013 0452   RDW 13.3 03/13/2011 0941   LYMPHSABS 1.4 08/05/2013 0547   LYMPHSABS 1.5 03/13/2011 0941   MONOABS 1.2* 08/05/2013 0547   MONOABS 0.6 03/13/2011 0941   EOSABS 0.0 08/05/2013 0547   EOSABS 0.3 03/13/2011 0941   BASOSABS 0.0 08/05/2013 0547   BASOSABS 0.0 03/13/2011  0941    BMET    Component Value Date/Time   NA 136* 08/06/2013 0452   K 4.2 08/06/2013 0452   CL 98 08/06/2013 0452   CO2 25 08/06/2013 0452   GLUCOSE 108* 08/06/2013 0452   BUN 16 08/06/2013 0452   CREATININE 1.20 08/06/2013 0452   CREATININE 1.15 08/02/2010 1742   CALCIUM 9.3 08/06/2013  0452   GFRNONAA 53* 08/06/2013 0452   GFRAA 61* 08/06/2013 0452    CMP     Component Value Date/Time   NA 136* 08/06/2013 0452   K 4.2 08/06/2013 0452   CL 98 08/06/2013 0452   CO2 25 08/06/2013 0452   GLUCOSE 108* 08/06/2013 0452   BUN 16 08/06/2013 0452   CREATININE 1.20 08/06/2013 0452   CREATININE 1.15 08/02/2010 1742   CALCIUM 9.3 08/06/2013 0452   PROT 6.6 08/05/2013 0547   ALBUMIN 2.8* 08/05/2013 0547   AST 14 08/05/2013 0547   ALT <5 08/05/2013 0547   ALKPHOS 83 08/05/2013 0547   BILITOT 1.3* 08/05/2013 0547   GFRNONAA 53* 08/06/2013 0452   GFRAA 61* 08/06/2013 0452    Time In Time Out Total Time Spent with Patient Total Overall Time  1210 1340 65mn 961m    Greater than 50%  of this time was spent counseling and coordinating care related to the above assessment and plan.  AlVinie SillNP Palliative Medicine Team Pager # [phone]-F 8a-5p) Team Phone # [phone]/Weekends)

## 2013-08-08 MED ORDER — MEGESTROL ACETATE 40 MG/ML PO SUSP: 400.0000 mg | Freq: Every day | ORAL | Status: AC

## 2013-08-08 MED ORDER — ENSURE COMPLETE PO LIQD: 237.0000 mL | Freq: Two times a day (BID) | ORAL | Status: AC

## 2013-08-08 MED ORDER — MEGESTROL ACETATE 40 MG PO TABS: 40.0000 mg | ORAL_TABLET | Freq: Every day | ORAL | Status: DC

## 2013-08-08 MED ORDER — IPRATROPIUM-ALBUTEROL 0.5-2.5 (3) MG/3ML IN SOLN: 3.0000 mL | Freq: Four times a day (QID) | RESPIRATORY_TRACT | Status: AC | PRN

## 2013-08-08 MED ORDER — MEGESTROL ACETATE 40 MG/ML PO SUSP
400.0000 mg | Freq: Every day | ORAL | Status: DC
  Administered 2013-08-08: 400 mg via ORAL
  Filled 2013-08-08: qty 10

## 2013-08-08 NOTE — Discharge Summary (Signed)
Physician Discharge Summary  Patient ID: [name] MRN: [id number] DOB/AGE: 1923/05/21 80 y.o.  Admit date: 08/04/2013 Discharge date: 08/08/2013  Primary Care Physician:  Tamsen Roers, MD  Discharge Diagnoses:    . Syncope . Failure to thrive   COPD    Hypertension   Hyperlipidemia    CAD      Consults:  Palliative medicine   Recommendations for Outpatient Follow-up:   palliative to follow at skilled nursing facility   Allergies:  No Known Allergies   Discharge Medications:   Medication List         aspirin 81 MG EC tablet  Take 81 mg by mouth daily.     beta carotene w/minerals tablet  Take 1 tablet by mouth daily.     feeding supplement (ENSURE COMPLETE) Liqd  Take 237 mLs by mouth 2 (two) times daily between meals.     ipratropium-albuterol 0.5-2.5 (3) MG/3ML Soln  Commonly known as:  DUONEB  Take 3 mLs by nebulization every 6 (six) hours as needed.     isosorbide dinitrate 10 MG tablet  Commonly known as:  ISORDIL  Take 10 mg by mouth daily.     megestrol 40 MG/ML suspension  Commonly known as:  MEGACE  Take 10 mLs (400 mg total) by mouth daily.     metoprolol tartrate 25 MG tablet  Commonly known as:  LOPRESSOR  Take 25 mg by mouth daily.     nitroGLYCERIN 0.4 MG SL tablet  Commonly known as:  NITROSTAT  Place 0.4 mg under the tongue every 5 (five) minutes as needed for chest pain.     pravastatin 80 MG tablet  Commonly known as:  PRAVACHOL  Take 80 mg by mouth at bedtime.         Brief H and P: For complete details please refer to admission H and P, but in brief This is a 80 y.o. year old male with multiple medical problems including dementia, COPD, recurrent presyncope, ischemic cardiomyopathy, hx/o prostate and bladder cancer presenting with failure. Pt currently lives at home and has multiple family members she will just take care of him in addition to having a private home health nurse come in 6 hours a day. Per the niece, patient  has had progressive decline over the past several months with multiple episodes of falls, nonspecific presyncopal spells, confusion, decreased by mouth intake. Patient's have formal evaluation by cardiology for presyncopal spells. Was unable to tolerate a Holter monitor, the last admission did show some questionable intermittent bradycardia. Patient is unable to perform any ADLs at home independently. The patient's primary caregiver who is his sister recently broke her arm and has had worsening difficulty taking care of the patient. Per the niece, patient has been evaluated multiple times in the ER for similar symptoms with otherwise negative workup. Per the niece, patient was not swallowing his pills today which was a reason for patient to the hospital.  Initial workup in the ER was otherwise within normal limits apart from a elevated white blood cell count of 13.6. Chest x-ray did not show any pulmonary infiltrate but did show a 1.8 cm right upper lobe nodule. Urinalysis negative for any signs of infection. Patient was ambulated in the ER with noted desats into the mid to upper 80s. A CTA was obtained to rule out PE. CTA showed a right upper lobe mass compatible with neoplasm as well as a mass along the posterior upper right major fissure also concerning for neoplasm. Head  and C-spine CT also obtained that was negative for any acute intracranial abnormalities but did show degenerative changes as well as a 1 cm nodule within the thyroid gland. The family in the past has been resistant to nursing home or hospice placement. However they feel that they have exhausted their ability to care for the patient.   Hospital Course:  1-Failure to thrive; multifactorial contributions of dementia, primary versus metastatic cancer. No evidence of infection UA with no WBC, CT no evidence of infection. TSH within normal range.   2-Lung Mass: CT chest showed 1.5 x 2 cm irregular mass within the anterior right upper lobe is  compatible with neoplasm. A 1.1 cm nodule along the posterior superior right major fissure this suspicious for neoplasm/ metastasis. Adjacent peripheral 1.3 cm soft tissue/mass or atelectasis noted.  Palliative medicine was consulted for goals of care. Patient and his family decided not to pursue any intervention regarding the lung mass. They decided to continue medical treatment, DO NOT RESUSCITATE, no feeding tube placement. Date requested skilled nursing facility with palliative to follow and hopefully transition to hospice.  3-COPD currently stable Continue PRN nebulizer   4-Cardiomyopathy with chronic systolic CHF  - resume metoprolol and Isordil.  - Last echo in 02-2013 showed EF of 30-35%, not on ACE inhibitors due to orthostatic hypotension per cardiology office notes in 2014   5-Leukocytosis; no evidence of infection.   6-hx/o prostate and bladder cancer; palliative approach    Day of Discharge BP 152/79  Pulse 86  Temp(Src) 98.8 F (37.1 C) (Oral)  Resp 18  Ht 5\' 7"  (1.702 m)  Wt 58.755 kg (129 lb 8.5 oz)  BMI 20.28 kg/m2  SpO2 99%  Physical Exam:  General: alert and awake  CVS: S1-S2 clear, no murmur rubs or gallops  Chest: CTAB, no wheezing, rales or rhonchi  Abdomen: soft nontender, nondistended, normal bowel sounds  Extremities: no cyanosis, clubbing , trace edema noted bilaterally   The results of significant diagnostics from this hospitalization (including imaging, microbiology, ancillary and laboratory) are listed below for reference.    LAB RESULTS: Basic Metabolic Panel:  Recent Labs Lab 08/05/13 0547 08/06/13 0452  NA 138 136*  K 4.4 4.2  CL 99 98  CO2 22 25  GLUCOSE 95 108*  BUN 14 16  CREATININE 1.05 1.20  CALCIUM 9.5 9.3   Liver Function Tests:  Recent Labs Lab 08/04/13 1313 08/05/13 0547  AST 15 14  ALT <5 <5  ALKPHOS 88 83  BILITOT 1.6* 1.3*  PROT 7.1 6.6  ALBUMIN 3.2* 2.8*    Recent Labs Lab 08/04/13 1313  LIPASE 27    No results found for this basename: AMMONIA,  in the last 168 hours CBC:  Recent Labs Lab 08/05/13 0547 08/06/13 0452  WBC 13.8* 11.3*  NEUTROABS 11.2*  --   HGB 15.6 14.0  HCT 46.4 41.1  MCV 93.5 93.0  PLT 208 229   Cardiac Enzymes: No results found for this basename: CKTOTAL, CKMB, CKMBINDEX, TROPONINI,  in the last 168 hours BNP: No components found with this basename: POCBNP,  CBG: No results found for this basename: GLUCAP,  in the last 168 hours  Significant Diagnostic Studies:  Dg Chest 2 View  08/04/2013   CLINICAL DATA:  Failure to thrive, cough, chest pain. Altered mental status.  EXAM: CHEST  2 VIEW  COMPARISON:  06/23/2013  FINDINGS: Heart is normal size. Prior median sternotomy. Left lung is clear. Right upper lobe pulmonary nodule measures approximately  1.8 cm, stable. No effusions. No acute bony abnormality.  IMPRESSION: Stable approximately 1.8 cm right upper lobe pulmonary nodule.  No acute findings.   Electronically Signed   By: Rolm Baptise M.D.   On: 08/04/2013 14:41   Ct Angio Chest Pe W/cm &/or Wo Cm  08/04/2013   CLINICAL DATA:  Shortness of breath, hypoxia and elevated D-dimer.  EXAM: CT ANGIOGRAPHY CHEST WITH CONTRAST  TECHNIQUE: Multidetector CT imaging of the chest was performed using the standard protocol during bolus administration of intravenous contrast. Multiplanar CT image reconstructions and MIPs were obtained to evaluate the vascular anatomy.  CONTRAST:  45mL OMNIPAQUE IOHEXOL 350 MG/ML SOLN  COMPARISON:  01/16/2012 and prior chest CTs  FINDINGS: This is a technically satisfactory study.  No pulmonary emboli are identified.  There is no evidence thoracic aortic aneurysm.  Mild cardiomegaly, cardiac surgical changes and coronary artery calcifications again noted.  There are no pleural or pericardial effusions.  A 1.5 x 2 cm irregular mass within the anterior right upper lobe (image 28) is compatible with neoplasm.  A 1.1 cm nodule along the posterior  superior right major fissure (image 41) this suspicious for neoplasm/ metastasis. Adjacent peripheral 1.3 cm soft tissue/mass or atelectasis noted.  No enlarged lymph nodes are identified.  A 6 mm right upper lobe nodule (image 35) is unchanged.  Mild bibasilar atelectasis/scarring noted.  Right basilar pleural calcifications are present.  The visualized upper abdomen is unremarkable.  No acute or suspicious bony abnormalities are identified.  Review of the MIP images confirms the above findings.  IMPRESSION: No evidence of pulmonary emboli or thoracic aortic aneurysm.  1.5 x 2 cm irregular anterior right upper lobe mass compatible with neoplasm/bronchogenic carcinoma.  1.1 cm nodule and adjacent 1.3 cm soft tissue along the posterior upper right major fissure suspicious for neoplasm/metastasis.  Cardiomegaly and prior cardiac surgical changes.   Electronically Signed   By: Hassan Rowan M.D.   On: 08/04/2013 17:45   Ct Cervical Spine Wo Contrast  08/04/2013   CLINICAL DATA:  Failure to thrive  EXAM: CT HEAD WITHOUT CONTRAST  CT CERVICAL SPINE WITHOUT CONTRAST  TECHNIQUE: Multidetector CT imaging of the head and cervical spine was performed following the standard protocol without intravenous contrast. Multiplanar CT image reconstructions of the cervical spine were also generated.  COMPARISON:  Prior head CT 11/05/2011  FINDINGS: CT HEAD FINDINGS  Negative for acute intracranial hemorrhage, acute infarction, mass, mass effect, hydrocephalus or midline shift. Gray-white differentiation is preserved throughout. No significant interval change in the appearance of diffuse cerebral and cerebellar atrophy with ex vacuo ventriculomegaly of the lateral ventricles. Advanced confluent periventricular, subcortical and deep white matter hypoattenuation is most consistent with the sequelae of longstanding microvascular ischemic white matter disease. No focal soft tissue or calvarial abnormality. Left mastoid effusion. Fluid extends  into the middle ear. The right mastoid air cells and visualized paranasal sinuses are well aerated. Atherosclerotic calcifications noted in the bilateral cavernous and supra clinoid internal carotid arteries.  CT CERVICAL SPINE FINDINGS  No acute fracture, malalignment or prevertebral soft tissue swelling. Multilevel degenerative disc disease most significant at C6-C7. Additionally, there is uncovertebral joint hypertrophy on the left at C4-C5 and C5-C6 resulting in a minimal foraminal narrowing. Advanced bulky degenerative changes are noted at the atlantodental interval. There is fragmentation versus dystrophic calcification in the associated transverse ligament as well as a subchondral cyst in the dens. No aggressive appearing lytic or blastic osseous lesion. Heterogeneous thyroid gland. There is  a circumscribed 1.2 cm low-attenuation nodule in the mid aspect of the right gland. Atherosclerotic vascular calcifications noted in the bilateral carotid arteries. No acute soft tissue abnormality. The lung apices are unremarkable.  IMPRESSION: CT HEAD  1. No acute intracranial abnormality. 2. Stable advanced cerebral and cerebellar atrophy A and advanced chronic microvascular ischemic white matter disease. 3. Left mastoid effusion.  Query clinical symptoms of mastoiditis? 4. Atherosclerotic calcification of the bilateral intracranial internal carotid arteries. CT C-SPINE  1. No acute fracture or malalignment. 2. Advanced bulky degenerative changes at the atlantodental interval. Differential considerations include advanced degenerative osteoarthritis, calcium pyrophosphate deposition disease, and rheumatoid arthritis. 3. Additional degenerative cervical spondylosis most notable at C6-C7. 4. Incidentally imaged 1.2 cm low-attenuation nodule in the mid right thyroid gland. 5. Carotid artery atherosclerotic vascular calcifications.   Electronically Signed   By: Jacqulynn Cadet M.D.   On: 08/04/2013 16:53    Disposition  and Follow-up: Discharge Orders   Future Appointments Provider Department Dept Phone   08/20/2013 11:00 AM Hulen Luster, [place] [facility] [phone]   Future Orders Complete By Expires   Diet general  As directed    Increase activity slowly  As directed        DISPOSITION: Skilled nursing facility   DIET: Regular diet    DISCHARGE FOLLOW-UP Follow-up Information   Follow up with LITTLE,JAMES, MD. Schedule an appointment as soon as possible for a visit in 2 weeks. (for hospital follow-up)    Specialty:  Family Medicine   Contact information:   [address] [place] [address] [phone]       Time spent on Discharge: 45 mins   Signed:   Amantha Sklar M.D. [facility] 08/08/2013, 11:09 AM Pager: [phone]

## 2013-08-08 NOTE — Progress Notes (Signed)
Clinical social worker assisted with patient discharge to skilled nursing facility, [facility].  CSW addressed all family questions and concerns. CSW copied chart and added all important documents. CSW also set up patient transportation with [employer] operational officer. Clinical Social Worker will sign off for now as social work intervention is no longer needed.   Rhea Pink, MSW, [place]

## 2013-08-20 ENCOUNTER — Ambulatory Visit: Payer: Medicare Other | Admitting: Neurology

## 2013-09-01 NOTE — Consult Note (Signed)
I have reviewed and discussed the care of this patient in detail with the nurse practitioner including pertinent patient records, physical exam findings and data. I agree with details of this encounter.  

## 2013-10-13 DEATH — deceased

## 2014-01-06 IMAGING — CR DG CHEST 2V
2 series · 2 of 2 positions shown · non-contrast
Comparison: 02/02/2013

CLINICAL DATA: Weakness, hypertension.

EXAM:
CHEST  2 VIEW

[w chest pa]
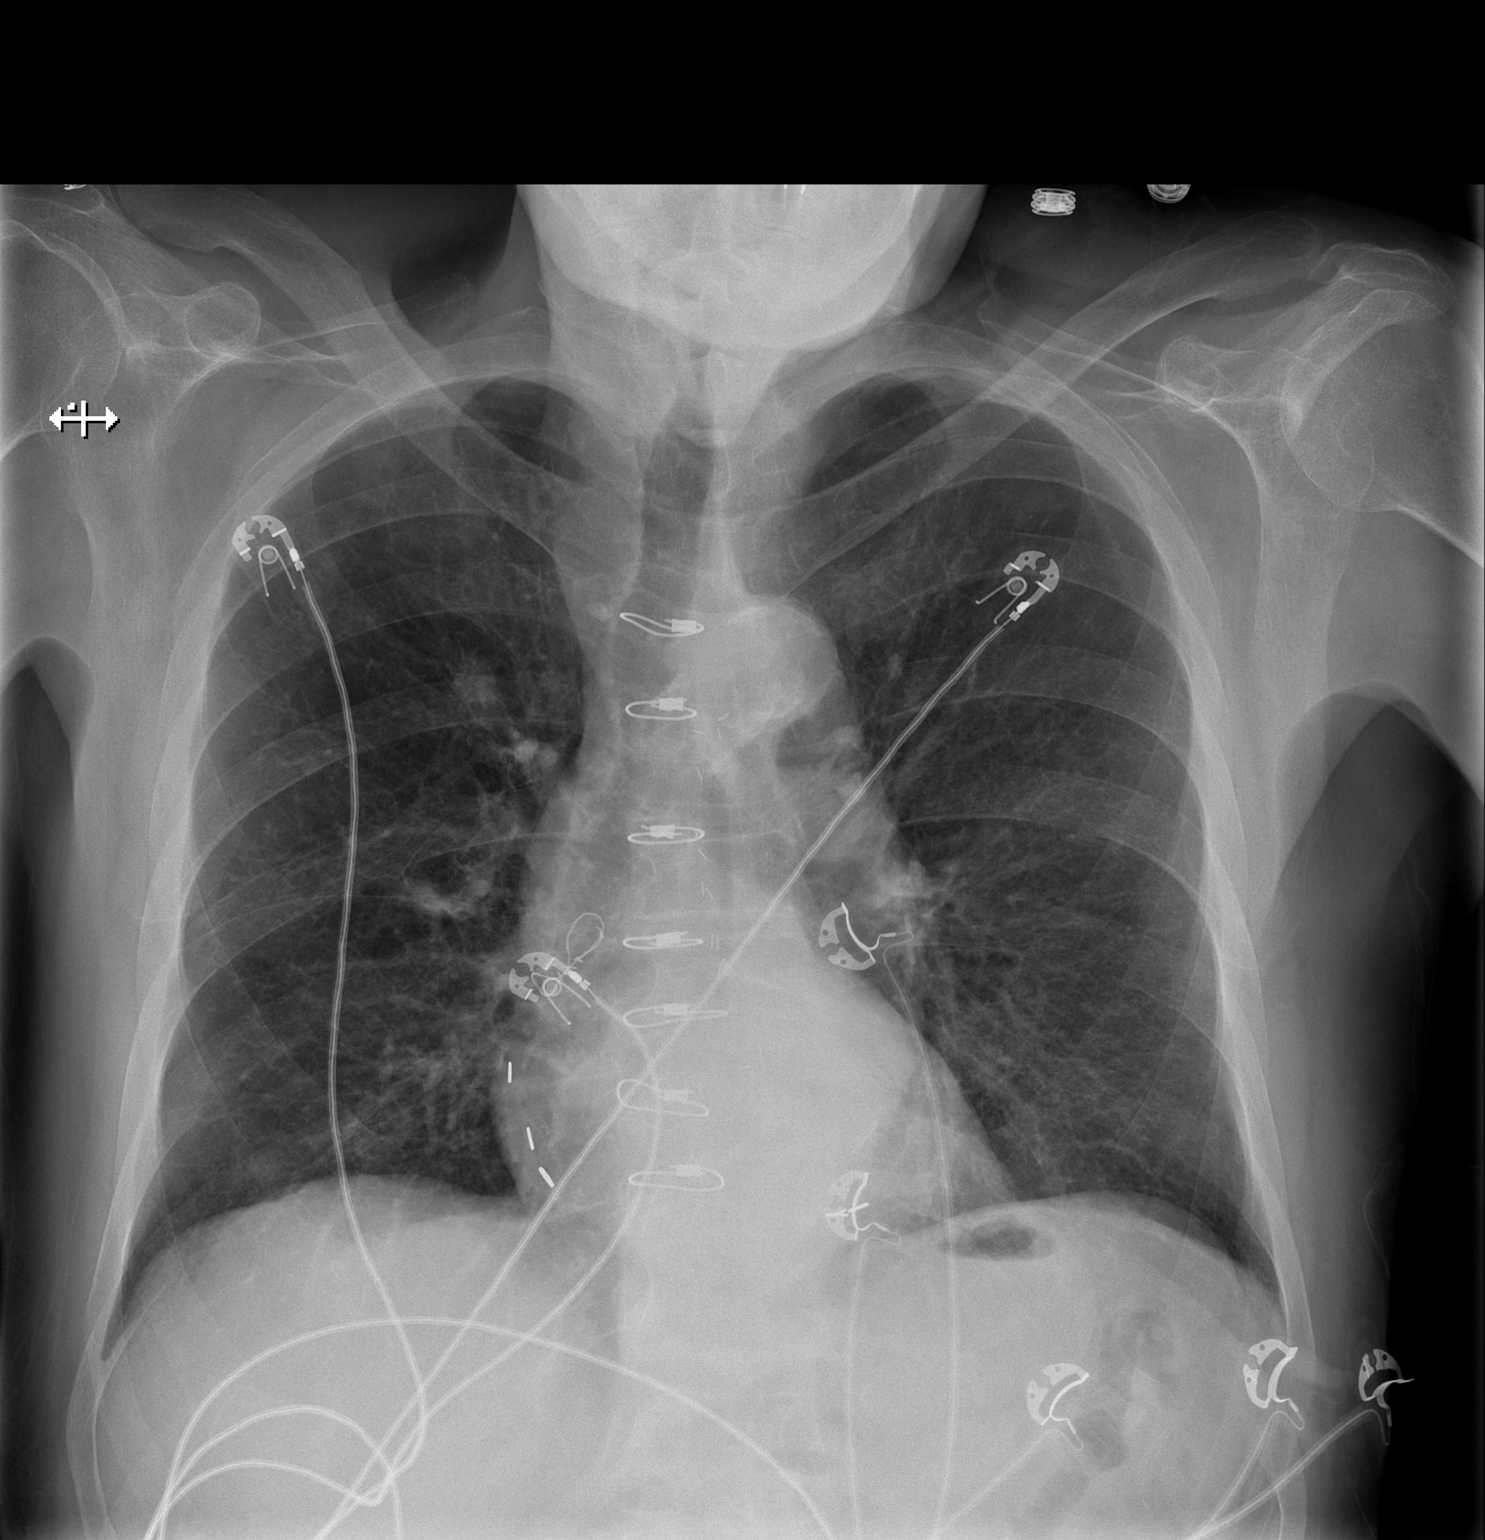

[w chest lat]
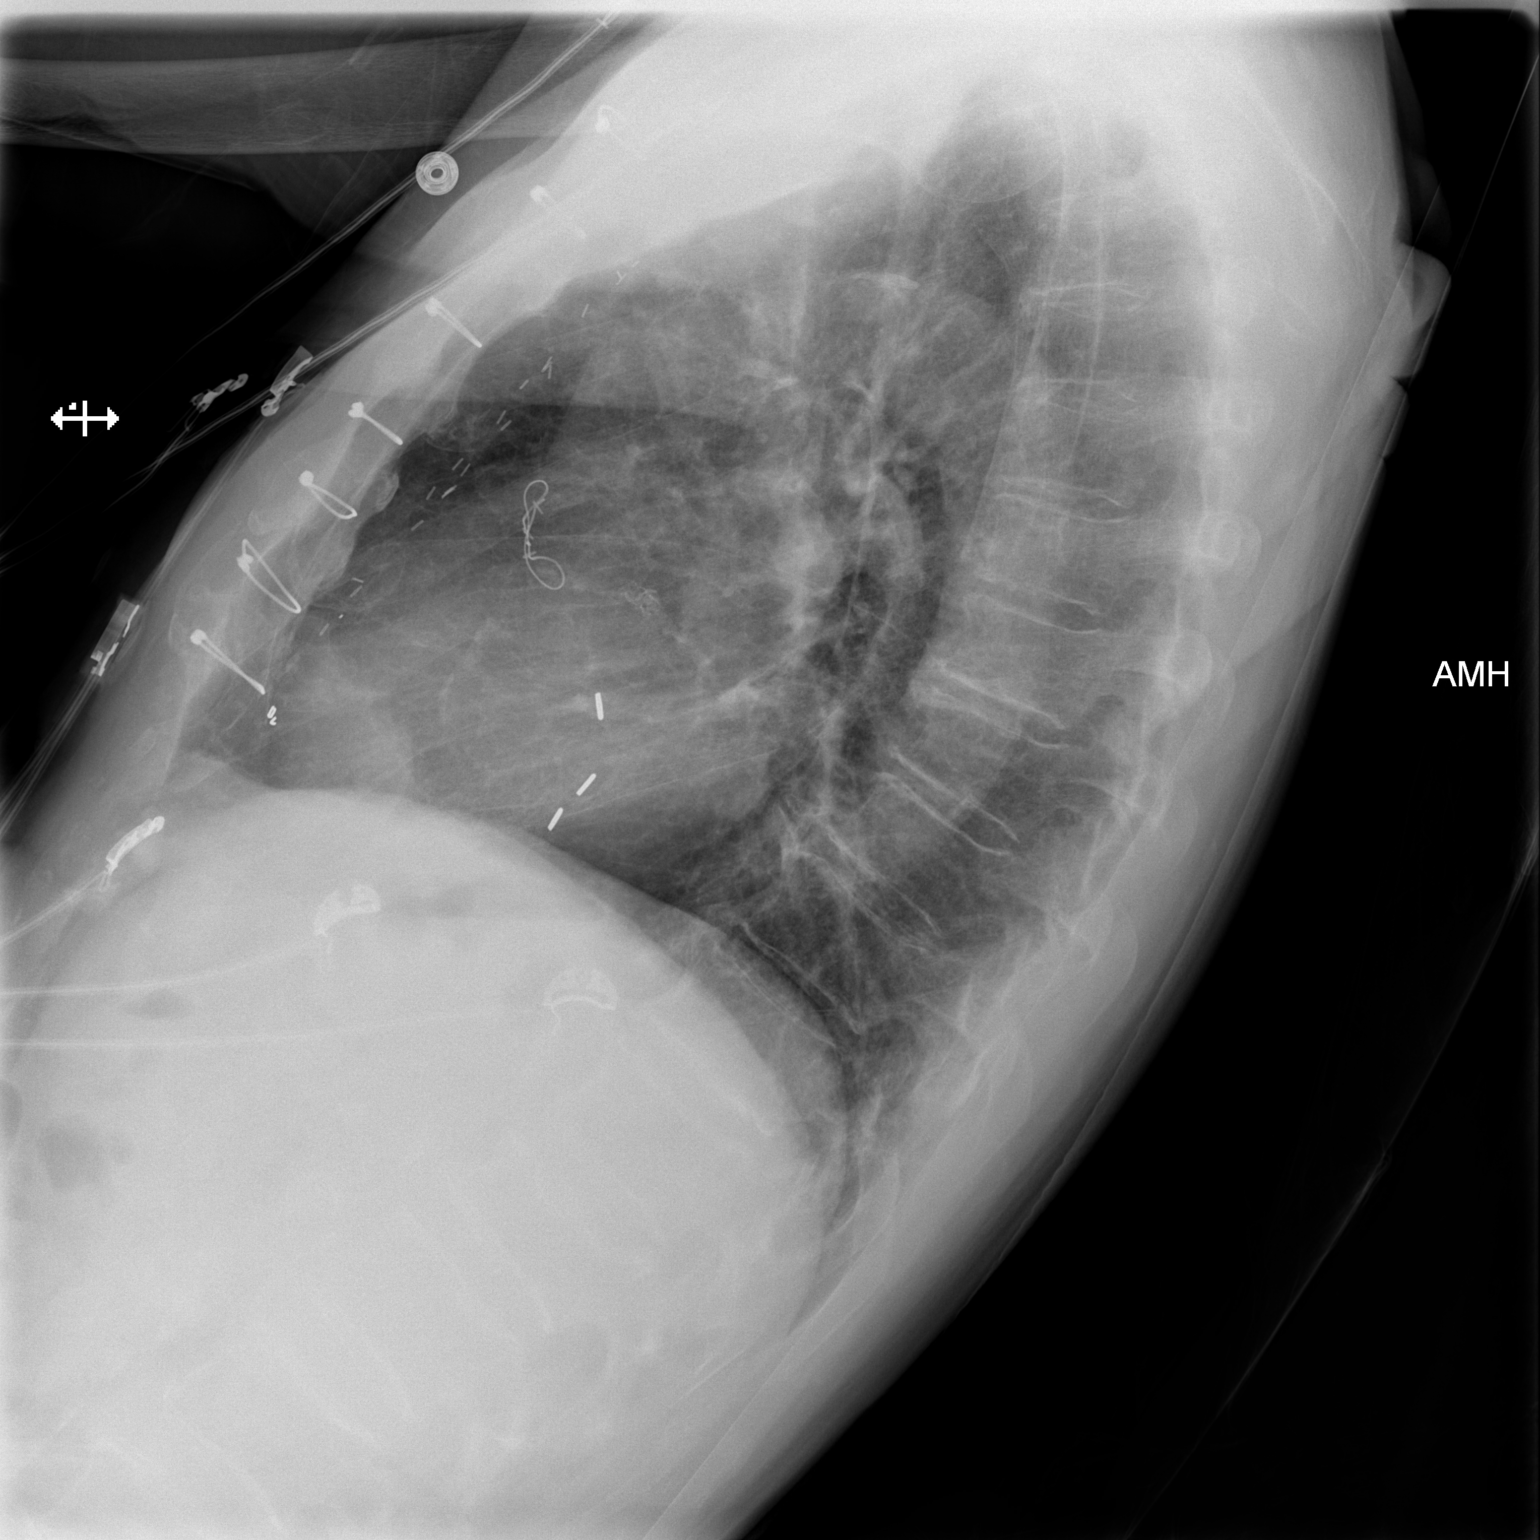

[2 of 2 positions shown; findings below may reference images not displayed]

FINDINGS: Previous CABG. Heart size normal. Tortuous atheromatous aorta. 13 mm
right suprahilar nodule (previously 16 mm). Smaller right upper lobe
nodule seen previously is no longer conspicuous. No new nodule or
infiltrate. No effusion.
IMPRESSION: Slight decrease in size of right suprahilar nodule. No new lesion
identified.

## 2014-04-23 ENCOUNTER — Encounter (HOSPITAL_COMMUNITY): Payer: Self-pay | Admitting: Cardiology
